# Patient Record
Sex: Male | Born: 1935 | Race: Black or African American | Hispanic: No
Health system: Southern US, Community
[De-identification: ages and names within clinical notes are randomized; demographics above are authoritative.]

## PROBLEM LIST (undated history)

## (undated) DIAGNOSIS — M1990 Unspecified osteoarthritis, unspecified site: Secondary | ICD-10-CM

## (undated) DIAGNOSIS — F4310 Post-traumatic stress disorder, unspecified: Secondary | ICD-10-CM

## (undated) DIAGNOSIS — N400 Enlarged prostate without lower urinary tract symptoms: Secondary | ICD-10-CM

## (undated) DIAGNOSIS — F0390 Unspecified dementia without behavioral disturbance: Secondary | ICD-10-CM

## (undated) DIAGNOSIS — E559 Vitamin D deficiency, unspecified: Secondary | ICD-10-CM

## (undated) DIAGNOSIS — D649 Anemia, unspecified: Secondary | ICD-10-CM

---

## 2017-05-22 ENCOUNTER — Inpatient Hospital Stay
Admission: EM | Admit: 2017-05-22 | Discharge: 2017-05-26 | DRG: 640 | Disposition: A | Discharge status: Hospice | Payer: Medicare Other | Attending: Internal Medicine | Admitting: Internal Medicine

## 2017-05-22 DIAGNOSIS — R0902 Hypoxemia: Secondary | ICD-10-CM | POA: Diagnosis present

## 2017-05-22 DIAGNOSIS — Z9181 History of falling: Secondary | ICD-10-CM

## 2017-05-22 DIAGNOSIS — E43 Unspecified severe protein-calorie malnutrition: Secondary | ICD-10-CM

## 2017-05-22 DIAGNOSIS — N179 Acute kidney failure, unspecified: Secondary | ICD-10-CM | POA: Diagnosis present

## 2017-05-22 DIAGNOSIS — Z79899 Other long term (current) drug therapy: Secondary | ICD-10-CM

## 2017-05-22 DIAGNOSIS — E860 Dehydration: Secondary | ICD-10-CM | POA: Diagnosis present

## 2017-05-22 DIAGNOSIS — M1990 Unspecified osteoarthritis, unspecified site: Secondary | ICD-10-CM | POA: Diagnosis present

## 2017-05-22 DIAGNOSIS — F4310 Post-traumatic stress disorder, unspecified: Secondary | ICD-10-CM | POA: Diagnosis present

## 2017-05-22 DIAGNOSIS — E559 Vitamin D deficiency, unspecified: Secondary | ICD-10-CM | POA: Diagnosis present

## 2017-05-22 DIAGNOSIS — H409 Unspecified glaucoma: Secondary | ICD-10-CM | POA: Diagnosis present

## 2017-05-22 DIAGNOSIS — E870 Hyperosmolality and hypernatremia: Principal | ICD-10-CM | POA: Diagnosis present

## 2017-05-22 DIAGNOSIS — N183 Chronic kidney disease, stage 3 (moderate): Secondary | ICD-10-CM | POA: Diagnosis present

## 2017-05-22 DIAGNOSIS — Z888 Allergy status to other drugs, medicaments and biological substances status: Secondary | ICD-10-CM

## 2017-05-22 DIAGNOSIS — Z7982 Long term (current) use of aspirin: Secondary | ICD-10-CM

## 2017-05-22 DIAGNOSIS — R7989 Other specified abnormal findings of blood chemistry: Secondary | ICD-10-CM

## 2017-05-22 DIAGNOSIS — F0390 Unspecified dementia without behavioral disturbance: Secondary | ICD-10-CM

## 2017-05-22 DIAGNOSIS — N400 Enlarged prostate without lower urinary tract symptoms: Secondary | ICD-10-CM | POA: Diagnosis present

## 2017-05-22 DIAGNOSIS — L8990 Pressure ulcer of unspecified site, unspecified stage: Secondary | ICD-10-CM

## 2017-05-22 DIAGNOSIS — Z515 Encounter for palliative care: Secondary | ICD-10-CM

## 2017-05-22 DIAGNOSIS — Z66 Do not resuscitate: Secondary | ICD-10-CM | POA: Diagnosis not present

## 2017-05-22 DIAGNOSIS — R1310 Dysphagia, unspecified: Secondary | ICD-10-CM

## 2017-05-22 DIAGNOSIS — Z681 Body mass index (BMI) 19 or less, adult: Secondary | ICD-10-CM

## 2017-05-22 DIAGNOSIS — I248 Other forms of acute ischemic heart disease: Secondary | ICD-10-CM | POA: Diagnosis present

## 2017-05-22 DIAGNOSIS — R778 Other specified abnormalities of plasma proteins: Secondary | ICD-10-CM

## 2017-05-22 DIAGNOSIS — R0602 Shortness of breath: Secondary | ICD-10-CM

## 2017-05-22 DIAGNOSIS — R627 Adult failure to thrive: Secondary | ICD-10-CM | POA: Diagnosis present

## 2017-05-22 DIAGNOSIS — G9340 Encephalopathy, unspecified: Secondary | ICD-10-CM | POA: Diagnosis present

## 2017-05-22 DIAGNOSIS — Z7189 Other specified counseling: Secondary | ICD-10-CM

## 2017-05-22 HISTORY — DX: Unspecified osteoarthritis, unspecified site: M19.90

## 2017-05-22 HISTORY — DX: Vitamin D deficiency, unspecified: E55.9

## 2017-05-22 HISTORY — DX: Unspecified dementia, unspecified severity, without behavioral disturbance, psychotic disturbance, mood disturbance, and anxiety: F03.90

## 2017-05-22 HISTORY — DX: Benign prostatic hyperplasia without lower urinary tract symptoms: N40.0

## 2017-05-22 HISTORY — DX: Post-traumatic stress disorder, unspecified: F43.10

## 2017-05-22 HISTORY — DX: Anemia, unspecified: D64.9

## 2017-05-22 LAB — CBC
HCT: 42 % (ref 40.0–52.0)
Hemoglobin: 13.2 g/dL (ref 13.0–18.0)
MCH: 27.9 pg (ref 26.0–34.0)
MCHC: 31.5 g/dL — ABNORMAL LOW (ref 32.0–36.0)
MCV: 88.7 fL (ref 80.0–100.0)
PLATELETS: 254 10*3/uL (ref 150–440)
RBC: 4.74 MIL/uL (ref 4.40–5.90)
RDW: 16.4 % — ABNORMAL HIGH (ref 11.5–14.5)
WBC: 14.9 10*3/uL — ABNORMAL HIGH (ref 3.8–10.6)

## 2017-05-22 NOTE — ED Triage Notes (Signed)
Pt brought in by [facility] for elevated Sodium of 162.  Per EMS, pt also recently finished course of antibiotics for pneumonia.  Pt continues to have a cough, unable to verbalize anything other than "I'm cold".

## 2017-05-22 NOTE — ED Notes (Signed)
Per Erie NoeVanessa, LPN at [facility], pt finished abx for aspiration pneumonia yesterday.  Per Erie NoeVanessa pt has had decreased PO intake since arrival [facility].  Pt dx with failure to thrive and pt had frequent falls prior to [facility].  Pt's baseline is to only be A&O to self.  No family to be coming to hospital tonight.

## 2017-05-23 ENCOUNTER — Emergency Department: Payer: Medicare Other

## 2017-05-23 DIAGNOSIS — R627 Adult failure to thrive: Secondary | ICD-10-CM | POA: Diagnosis present

## 2017-05-23 DIAGNOSIS — R0902 Hypoxemia: Secondary | ICD-10-CM | POA: Diagnosis present

## 2017-05-23 DIAGNOSIS — Z681 Body mass index (BMI) 19 or less, adult: Secondary | ICD-10-CM | POA: Diagnosis not present

## 2017-05-23 DIAGNOSIS — Z7982 Long term (current) use of aspirin: Secondary | ICD-10-CM | POA: Diagnosis not present

## 2017-05-23 DIAGNOSIS — Z79899 Other long term (current) drug therapy: Secondary | ICD-10-CM | POA: Diagnosis not present

## 2017-05-23 DIAGNOSIS — R1310 Dysphagia, unspecified: Secondary | ICD-10-CM | POA: Diagnosis present

## 2017-05-23 DIAGNOSIS — G9340 Encephalopathy, unspecified: Secondary | ICD-10-CM | POA: Diagnosis present

## 2017-05-23 DIAGNOSIS — E43 Unspecified severe protein-calorie malnutrition: Secondary | ICD-10-CM | POA: Diagnosis present

## 2017-05-23 DIAGNOSIS — E870 Hyperosmolality and hypernatremia: Principal | ICD-10-CM

## 2017-05-23 DIAGNOSIS — L8990 Pressure ulcer of unspecified site, unspecified stage: Secondary | ICD-10-CM | POA: Diagnosis present

## 2017-05-23 DIAGNOSIS — H409 Unspecified glaucoma: Secondary | ICD-10-CM | POA: Diagnosis present

## 2017-05-23 DIAGNOSIS — Z888 Allergy status to other drugs, medicaments and biological substances status: Secondary | ICD-10-CM | POA: Diagnosis not present

## 2017-05-23 DIAGNOSIS — Z515 Encounter for palliative care: Secondary | ICD-10-CM | POA: Diagnosis not present

## 2017-05-23 DIAGNOSIS — Z9181 History of falling: Secondary | ICD-10-CM | POA: Diagnosis not present

## 2017-05-23 DIAGNOSIS — N400 Enlarged prostate without lower urinary tract symptoms: Secondary | ICD-10-CM | POA: Diagnosis present

## 2017-05-23 DIAGNOSIS — N183 Chronic kidney disease, stage 3 (moderate): Secondary | ICD-10-CM | POA: Diagnosis present

## 2017-05-23 DIAGNOSIS — E860 Dehydration: Secondary | ICD-10-CM | POA: Diagnosis present

## 2017-05-23 DIAGNOSIS — Z7189 Other specified counseling: Secondary | ICD-10-CM

## 2017-05-23 DIAGNOSIS — N179 Acute kidney failure, unspecified: Secondary | ICD-10-CM | POA: Diagnosis present

## 2017-05-23 DIAGNOSIS — Z66 Do not resuscitate: Secondary | ICD-10-CM | POA: Diagnosis not present

## 2017-05-23 DIAGNOSIS — I248 Other forms of acute ischemic heart disease: Secondary | ICD-10-CM | POA: Diagnosis present

## 2017-05-23 DIAGNOSIS — E559 Vitamin D deficiency, unspecified: Secondary | ICD-10-CM | POA: Diagnosis present

## 2017-05-23 DIAGNOSIS — F4310 Post-traumatic stress disorder, unspecified: Secondary | ICD-10-CM | POA: Diagnosis present

## 2017-05-23 DIAGNOSIS — F0390 Unspecified dementia without behavioral disturbance: Secondary | ICD-10-CM | POA: Diagnosis not present

## 2017-05-23 DIAGNOSIS — M1990 Unspecified osteoarthritis, unspecified site: Secondary | ICD-10-CM | POA: Diagnosis present

## 2017-05-23 LAB — TROPONIN I
Troponin I: 0.09 ng/mL (ref <0.03)
Troponin I: 0.09 ng/mL (ref <0.03)
Troponin I: 0.11 ng/mL (ref <0.03)
Troponin I: 0.14 ng/mL (ref <0.03)

## 2017-05-23 LAB — COMPREHENSIVE METABOLIC PANEL
ALT: 12 U/L — AB (ref 17–63)
AST: 18 U/L (ref 15–41)
Albumin: 3.5 g/dL (ref 3.5–5.0)
Alkaline Phosphatase: 81 U/L (ref 38–126)
Anion gap: 10 (ref 5–15)
BUN: 45 mg/dL — ABNORMAL HIGH (ref 6–20)
CHLORIDE: 124 mmol/L — AB (ref 101–111)
CO2: 25 mmol/L (ref 22–32)
CREATININE: 1.78 mg/dL — AB (ref 0.61–1.24)
Calcium: 10.8 mg/dL — ABNORMAL HIGH (ref 8.9–10.3)
GFR calc non Af Amer: 31 mL/min — ABNORMAL LOW (ref >60)
GFR, EST AFRICAN AMERICAN: 36 mL/min — AB (ref >60)
Glucose, Bld: 112 mg/dL — ABNORMAL HIGH (ref 65–99)
Potassium: 4.2 mmol/L (ref 3.5–5.1)
SODIUM: 159 mmol/L — AB (ref 135–145)
Total Bilirubin: 0.7 mg/dL (ref 0.3–1.2)
Total Protein: 7.8 g/dL (ref 6.5–8.1)

## 2017-05-23 LAB — MRSA PCR SCREENING: MRSA by PCR: NEGATIVE

## 2017-05-23 LAB — SODIUM
SODIUM: 159 mmol/L — AB (ref 135–145)
SODIUM: 157 mmol/L — AB (ref 135–145)
Sodium: 154 mmol/L — ABNORMAL HIGH (ref 135–145)

## 2017-05-23 LAB — LACTIC ACID, PLASMA
LACTIC ACID, VENOUS: 1.8 mmol/L (ref 0.5–1.9)
Lactic Acid, Venous: 1.4 mmol/L (ref 0.5–1.9)

## 2017-05-23 LAB — GLUCOSE, CAPILLARY: Glucose-Capillary: 114 mg/dL — ABNORMAL HIGH (ref 65–99)

## 2017-05-23 LAB — TSH: TSH: 0.964 u[IU]/mL (ref 0.350–4.500)

## 2017-05-23 MED ORDER — ASPIRIN EC 81 MG PO TBEC
81.0000 mg | DELAYED_RELEASE_TABLET | Freq: Every day | ORAL | Status: DC
  Filled 2017-05-23: qty 1

## 2017-05-23 MED ORDER — ACETAMINOPHEN 325 MG PO TABS: 650.0000 mg | ORAL_TABLET | Freq: Four times a day (QID) | ORAL | Status: DC | PRN

## 2017-05-23 MED ORDER — SERTRALINE HCL 50 MG PO TABS: 50.0000 mg | ORAL_TABLET | Freq: Every day | ORAL | Status: DC

## 2017-05-23 MED ORDER — POLYVINYL ALCOHOL 1.4 % OP SOLN
1.0000 [drp] | Freq: Three times a day (TID) | OPHTHALMIC | Status: DC
Start: 2017-05-23 — End: 2017-05-25
  Administered 2017-05-23 – 2017-05-25 (×5): 1 [drp] via OPHTHALMIC
  Filled 2017-05-23: qty 15

## 2017-05-23 MED ORDER — ORAL CARE MOUTH RINSE
15.0000 mL | Freq: Two times a day (BID) | OROMUCOSAL | Status: DC
  Administered 2017-05-23 – 2017-05-24 (×3): 15 mL via OROMUCOSAL

## 2017-05-23 MED ORDER — DOCUSATE SODIUM 100 MG PO CAPS
100.0000 mg | ORAL_CAPSULE | Freq: Two times a day (BID) | ORAL | Status: DC
  Filled 2017-05-23: qty 1

## 2017-05-23 MED ORDER — DEXTROSE 5 % IV SOLN
INTRAVENOUS | Status: DC
  Administered 2017-05-23 – 2017-05-24 (×4): via INTRAVENOUS

## 2017-05-23 MED ORDER — CARBOXYMETHYLCELLULOSE SODIUM 0.5 % OP SOLN: 1.0000 [drp] | Freq: Three times a day (TID) | OPHTHALMIC | Status: DC

## 2017-05-23 MED ORDER — GUAIFENESIN 100 MG/5ML PO SOLN
200.0000 mg | Freq: Three times a day (TID) | ORAL | Status: DC | PRN
  Filled 2017-05-23: qty 10

## 2017-05-23 MED ORDER — IPRATROPIUM-ALBUTEROL 0.5-2.5 (3) MG/3ML IN SOLN: 3.0000 mL | Freq: Four times a day (QID) | RESPIRATORY_TRACT | Status: DC | PRN

## 2017-05-23 MED ORDER — ACETAMINOPHEN 650 MG RE SUPP: 650.0000 mg | Freq: Four times a day (QID) | RECTAL | Status: DC | PRN

## 2017-05-23 MED ORDER — VITAMIN D 1000 UNITS PO TABS
1000.0000 [IU] | ORAL_TABLET | Freq: Every day | ORAL | Status: DC
  Filled 2017-05-23: qty 1

## 2017-05-23 MED ORDER — SODIUM CHLORIDE 0.9 % IV BOLUS (SEPSIS)
1000.0000 mL | Freq: Once | INTRAVENOUS | Status: AC
  Administered 2017-05-23: 1000 mL via INTRAVENOUS

## 2017-05-23 MED ORDER — ONDANSETRON HCL 4 MG PO TABS: 4.0000 mg | ORAL_TABLET | Freq: Four times a day (QID) | ORAL | Status: DC | PRN

## 2017-05-23 MED ORDER — RISAQUAD PO CAPS
1.0000 | ORAL_CAPSULE | Freq: Two times a day (BID) | ORAL | Status: DC
  Filled 2017-05-23: qty 1

## 2017-05-23 MED ORDER — ONDANSETRON HCL 4 MG/2ML IJ SOLN: 4.0000 mg | Freq: Four times a day (QID) | INTRAMUSCULAR | Status: DC | PRN

## 2017-05-23 MED ORDER — TRAMADOL HCL 50 MG PO TABS: 25.0000 mg | ORAL_TABLET | Freq: Three times a day (TID) | ORAL | Status: DC | PRN

## 2017-05-23 MED ORDER — FINASTERIDE 5 MG PO TABS
5.0000 mg | ORAL_TABLET | Freq: Every day | ORAL | Status: DC
  Filled 2017-05-23: qty 1

## 2017-05-23 MED ORDER — MELATONIN 5 MG PO TABS
5.0000 mg | ORAL_TABLET | Freq: Every day | ORAL | Status: DC
  Filled 2017-05-23 (×3): qty 1

## 2017-05-23 MED ORDER — SENNOSIDES-DOCUSATE SODIUM 8.6-50 MG PO TABS: 2.0000 | ORAL_TABLET | Freq: Every day | ORAL | Status: DC | PRN

## 2017-05-23 MED ORDER — TAMSULOSIN HCL 0.4 MG PO CAPS: 0.4000 mg | ORAL_CAPSULE | Freq: Every day | ORAL | Status: DC

## 2017-05-23 MED ORDER — HYDROCERIN EX CREA
1.0000 "application " | TOPICAL_CREAM | Freq: Two times a day (BID) | CUTANEOUS | Status: DC
  Administered 2017-05-23 – 2017-05-26 (×7): 1 via TOPICAL
  Filled 2017-05-23: qty 113

## 2017-05-23 MED ORDER — POTASSIUM CHLORIDE CRYS ER 20 MEQ PO TBCR: 20.0000 meq | EXTENDED_RELEASE_TABLET | Freq: Every day | ORAL | Status: DC

## 2017-05-23 MED ORDER — DORZOLAMIDE HCL-TIMOLOL MAL 2-0.5 % OP SOLN
1.0000 [drp] | Freq: Two times a day (BID) | OPHTHALMIC | Status: DC
  Administered 2017-05-23 – 2017-05-26 (×7): 1 [drp] via OPHTHALMIC
  Filled 2017-05-23: qty 10

## 2017-05-23 MED ORDER — HEPARIN SODIUM (PORCINE) 5000 UNIT/ML IJ SOLN
5000.0000 [IU] | Freq: Three times a day (TID) | INTRAMUSCULAR | Status: DC
  Administered 2017-05-23 – 2017-05-25 (×5): 5000 [IU] via SUBCUTANEOUS
  Filled 2017-05-23 (×6): qty 1

## 2017-05-23 NOTE — Progress Notes (Addendum)
Family Meeting Note  Advance Directive:yes  Today a meeting took place with the Patient's healthcare power of attorney Mr. Ronald James in patient's room.  Patient is encephalopathic and unable to participate in the conversation [facility] to [facility] needs to be full code at the standpoint as patient expressed to be full code in the past.  Agreeable with the palliative care consult The diagnosis and plan of care discussed in detail with the patient's POA    The following clinical team members were present during this meeting:md, rn  The following were discussed:Patient's diagnosis: , Patient's progosis: Unable to determine and Goals for treatment: Full Code  Additional follow-up to be provided: Hospitalist and palliative care  Time spent during discussion: 18 min  Ramonita LabAruna Shakira Los, MD

## 2017-05-23 NOTE — Progress Notes (Signed)
Initial Nutrition Assessment  DOCUMENTATION CODES:   Severe malnutrition in context of chronic illness, Underweight  INTERVENTION:  No appropriate nutrition intervention at this time as patient is unsafe for PO intake.  Will monitor outcome of discussions regarding goals of care.  If patient becomes alert enough for SLP assessment and PO intake, recommend liberalizing sodium restriction.  NUTRITION DIAGNOSIS:   Severe Malnutrition related to chronic illness(CKD III, dementia, advanced age) as evidenced by severe fat depletion, severe muscle depletion.   GOAL:   Patient will meet greater than or equal to 90% of their needs  MONITOR:   PO intake, Supplement acceptance, Labs, Weight trends, Skin, I & O's  REASON FOR ASSESSMENT:   Malnutrition Screening Tool, Consult Assessment of nutrition requirement/status, Poor PO  ASSESSMENT:   84 year old male with PMHx of dementia, BPH, OA, PTSD, vitamin D deficiency, CKD III admitted with hypernatremia.   -Pending PMT consult and SLP consult.  Patient sleeping at time of RD assessment. Not alert enough to provide history and no family members present at bedside. Per chart patient is on a mechanical soft diet with thickened liquids at home. Discussed with RN. Patient is unsafe for PO intake at this time.  Meal Completion: 0%  Medications reviewed and include: acidophilus, vitamin D 1000 units daily, Colace, D5W at 100 mL/hr (120 grams dextrose, 408 kcal daily).  Labs reviewed: Sodium 159, Chloride 124, BUN 45, Creatinine 1.78, elevated Troponin.  NUTRITION - FOCUSED PHYSICAL EXAM:    Most Recent Value  Orbital Region  Severe depletion  Upper Arm Region  Severe depletion  Thoracic and Lumbar Region  Severe depletion  Buccal Region  Severe depletion  Temple Region  Severe depletion  Clavicle Bone Region  Severe depletion  Clavicle and Acromion Bone Region  Severe depletion  Scapular Bone Region  Unable to assess  Dorsal Hand   Severe depletion  Patellar Region  Severe depletion  Anterior Thigh Region  Severe depletion  Posterior Calf Region  Severe depletion  Edema (RD Assessment)  None  Hair  Reviewed  Eyes  Unable to assess  Mouth  Unable to assess  Skin  Reviewed  Nails  Reviewed     Diet Order:  Diet 2 gram sodium Room service appropriate? Yes; Fluid consistency: Thin  EDUCATION NEEDS:   Not appropriate for education at this time  Skin:  Skin Assessment: Reviewed RN Assessment(boggy heels)  Last BM:  05/23/2017  Height:   Ht Readings from Last 1 Encounters:  No data found for Ht    Weight:   Wt Readings from Last 1 Encounters:  05/23/17 106 lb 3.2 oz (48.2 kg)    Ideal Body Weight:  67.3 kg(calculated with height of 5\' 7"  found in Care Everywhere)  BMI:  There is no height or weight on file to calculate BMI. BMI 16.6 kg/m2  Estimated Nutritional Needs:   Kcal:  1205-1445 (25-30 kcal/kg)  Protein:  72-82 grams (1.5-1.7 grams/kg)  Fluid:  1.2 L/day (25 mL/kg)  [name], MS, RD, LDN Office: [phone] Pager: [phone] After Hours/Weekend Pager: [phone]

## 2017-05-23 NOTE — Plan of Care (Signed)
VS WDL, free of falls since admission to unit.  Denies pain, no needs.  Disoriented, not impulsive since arriving to unit.  [facility]  Remainder of profile will be completed when wife arrives today.  Bed in low position, call bell within reach.  WCTM.

## 2017-05-23 NOTE — ED Provider Notes (Signed)
[facility] Emergency Department Provider Note   ____________________________________________   First MD Initiated Contact with Patient 05/22/17 2334     (approximate)  I have reviewed the triage vital signs and the nursing notes.   HISTORY  Chief Complaint Abnormal Lab  Patient with dementia so unable to provide history  HPI Ronald James is a 84 y.o. male who comes into the hospital today from his nursing home.  The nursing home states that the patient had some elevated sodium.  According to staff they state that they drew labs for the patient and they were told that the labs were elevated.  He has a history of kidney failure and failure to thrive.  They report that he recently completed antibiotics for aspiration pneumonia.  The patient has dementia according to staff.  He knows who he is but that is approximately it.  The patient has not been eating or drinking and does not walk on his own.  He also has a history of falls.  The patient thinks it is Sunday and does not know the month.  He keeps saying that he needs to go to the trash and according to the staff at the nursing home he keeps stating that he needs someone to turn the heat up because he is cold.  He is here today for evaluation and treatment.  The patient denies any pain but he does mumble so it is difficult to understand what he says.   Past Medical History:  Diagnosis Date  . Anemia   . BPH (benign prostatic hyperplasia)   . Dementia   . Osteoarthritis   . PTSD (post-traumatic stress disorder)   . Vitamin D deficiency     There are no active problems to display for this patient.   History reviewed. No pertinent surgical history.  Prior to Admission medications   Not on File    Allergies Brimonidine; Latanoprost; Lisinopril; and Prazosin  No family history on file.  Social History Social History   Tobacco Use  . Smoking status: Unknown If Ever Smoked  Substance Use Topics  .  Alcohol use: No    Frequency: Never  . Drug use: No    Review of Systems  Constitutional: Failure to thrive Eyes: No visual changes. ENT: No sore throat. Cardiovascular: Denies chest pain. Respiratory: Denies shortness of breath. Gastrointestinal: No abdominal pain.   Genitourinary: Negative for dysuria. Musculoskeletal: Negative for back pain. Skin: Negative for rash. Neurological: Negative for headaches,   ____________________________________________   PHYSICAL EXAM:  VITAL SIGNS: ED Triage Vitals [05/22/17 2335]  Enc Vitals Group     BP 134/72     Pulse Rate 97     Resp (!) 22     Temp 97.7 F (36.5 C)     Temp Source Oral     SpO2 98 %     Weight 115 lb (52.2 kg)     Height      Head Circumference      Peak Flow      Pain Score      Pain Loc      Pain Edu?      Excl. in GC?     Constitutional: Alert and oriented to self.  Cachectic appearing and in no acute distress. Eyes: Conjunctivae are normal. PERRL. EOMI. Head: Atraumatic. Nose: No congestion/rhinnorhea. Mouth/Throat: Mucous membranes are moist.  Oropharynx non-erythematous. Cardiovascular: Normal rate, regular rhythm. Grossly normal heart sounds.  Good peripheral circulation. Respiratory: Normal respiratory effort.  No retractions.  Coarse breath sounds with some upper airway gurgling. Gastrointestinal: Soft and nontender. No distention.  Positive bowel sounds Musculoskeletal: No lower extremity tenderness nor edema.   Neurologic: Quiet garbled speech with some mumbling Skin:  Skin is warm, dry and intact.  Psychiatric: Mood and affect are normal.   ____________________________________________   LABS (all labs ordered are listed, but only abnormal results are displayed)  Labs Reviewed  CBC - Abnormal; Notable for the following components:      Result Value   WBC 14.9 (*)    MCHC 31.5 (*)    RDW 16.4 (*)    All other components within normal limits  COMPREHENSIVE METABOLIC PANEL - Abnormal;  Notable for the following components:   Sodium 159 (*)    Chloride 124 (*)    Glucose, Bld 112 (*)    BUN 45 (*)    Creatinine, Ser 1.78 (*)    Calcium 10.8 (*)    ALT 12 (*)    GFR calc non Af Amer 31 (*)    GFR calc Af Amer 36 (*)    All other components within normal limits  TROPONIN I - Abnormal; Notable for the following components:   Troponin I 0.11 (*)    All other components within normal limits  CULTURE, BLOOD (ROUTINE X 2)  CULTURE, BLOOD (ROUTINE X 2)  LACTIC ACID, PLASMA  LACTIC ACID, PLASMA   ____________________________________________  EKG  ED ECG REPORT I, Rebecka Apley, the attending physician, personally viewed and interpreted this ECG.   Date: 05/22/2017  EKG Time: 2313  Rate: 95  Rhythm: normal sinus rhythm  Axis: normal  Intervals:right bundle branch block and left anterior fascicular block  ST&T Change: Flipped T waves in lead V2, aVL  ____________________________________________  RADIOLOGY  Dg Chest Portable 1 View  Result Date: 05/23/2017 CLINICAL DATA:  84 year old male with cough. EXAM: PORTABLE CHEST 1 VIEW COMPARISON:  None. FINDINGS: The lungs are clear. There is no pleural effusion or pneumothorax. Top-normal cardiac silhouette. The aorta is tortuous. Osteopenia. No acute osseous pathology. IMPRESSION: No active disease. Electronically Signed   By: Elgie Collard M.D.   On: 05/23/2017 00:36    ____________________________________________   PROCEDURES  Procedure(s) performed: None  Procedures  Critical Care performed: No  ____________________________________________   INITIAL IMPRESSION / ASSESSMENT AND PLAN / ED COURSE  As part of my medical decision making, I reviewed the following data within the electronic MEDICAL RECORD NUMBER Notes from prior ED visits and [place] Controlled Substance Database  This is a 84 year old male who comes into the hospital today from his nursing home with an elevated sodium level.  The patient  has failure to thrive and has not been eating so I feel that he may have some hypernatremia due to dehydration.  I will give the patient a liter of normal saline.  I did order blood work and the patient's sodium here is 159 with a chloride of 124 the patient's creatinine is 1.78 and we do not have any previous labs to compare.  I did check a lactic acid which was 1.8.  I will admit the patient to the hospitalist service for hydration.  The patient's troponin is also elevated at 0.11.  He did receive a chest x-ray as well which was negative.  The patient does not have a good cough which is why he has the increased sounds of secretion.  He will be admitted and further evaluated.  ____________________________________________   FINAL CLINICAL IMPRESSION(S) / ED DIAGNOSES  Final diagnoses:  Hypernatremia  Elevated troponin     ED Discharge Orders    None       Note:  This document was prepared using Dragon voice recognition software and may include unintentional dictation errors.    Rebecka Apley, MD 05/23/17 [phone]

## 2017-05-23 NOTE — Clinical Social Work Note (Signed)
CSW received consult that patient is a [facility].  CSW to complete assessment at at a later time.  Ervin KnackEric R. Yehudit Fulginiti, MSW, Theresia MajorsLCSWA [phone]  05/23/2017 7:11 PM

## 2017-05-23 NOTE — Consult Note (Signed)
Consultation Note Date: 05/23/2017   Patient Name: Ronald James  DOB: [birth year]  MRN: [id number]  Age / Sex: 84 y.o., male  PCP: Patient, No Pcp Per Referring Physician: Nicholes Mango, MD  Reason for Consultation: Establishing goals of care  HPI/Patient Profile: 84 y.o. male  with past medical history of dementia, CKD stage III, BPH, osteoarthritis, PTSD, Vitamin D deficiency admitted on 05/22/2017 with hypernatremia r/t poor intake and dehydration. Palliative care requested to assist with [place] conversations.   Clinical Assessment and Goals of Care: I met today with Ronald James who is lethargic but arouses to answer my questions and then goes back to sleep. He is thin and frail. He denies any pains or discomforts and is pleasantly confused.   I called and spoke with his [place]. I validated his concern that Ronald James is not eating and drinking and is dehydrated. I further explained dementia and natural trajectory of dementia specifically in relation to aspiration and decreased intake. I explained that I fear this is a problem that will continue to reoccur and that we cannot reverse these complications of dementia. I explained that we can provide IVF to rehydrate but that is likely only temporary before this happens again.   I explained to Ronald James that we need to start talking about a plan to take good care of Ronald James if we cannot fix his problems. Ronald James says that he will call and speak further with Ronald James son and they will discuss how to discuss with Ronald James wife (who he reports is very ill herself d/t cancer). I asked Ronald James if they'd ever discussed resuscitation such as CPR/shock/intubation and life support. Ronald James says they will need to talk about this. I explained that I would hate to put Mr. Harriott through these measures since we know he has a condition that we cannot reverse. Mr.  Tyrone James seems to understand.   Ronald James says that he has a dental procedure he cannot postpone but would like to meet with me Thursday 1/17 1100 am and will check to see if Ronald James son, "Ronald James," can be available at this time as well. I will try and touch base with Ronald James again tomorrow. I provided him with my contact info as well. Emotional support provided.   Primary Decision Maker HCPOA friend Gasper Sells    SUMMARY Jennings to have further conversations with family about Mr. Ronald James decline - Plan to meet in person to better answer questions and develop plan  Code Status/Advance Care Planning:  Full code - family/HCPOA to discuss   Symptom Management:   None currently  Palliative Prophylaxis:   Aspiration, Bowel Regimen, Delirium Protocol, Frequent Pain Assessment, Oral Care and Turn Reposition  Additional Recommendations (Limitations, Scope, Preferences):  Full Scope Treatment  Psycho-social/Spiritual:   Desire for further Chaplaincy support:yes  Additional Recommendations: Education on Hospice and Grief/Bereavement Support  Prognosis:   Very poor with recent recurrent aspiration pneumonia and declining intake.  Discharge Planning: To Be Determined      Primary Diagnoses: Present on Admission: . Hypernatremia   I have reviewed the medical record, interviewed the patient and family, and examined the patient. The following aspects are pertinent.  Past Medical History:  Diagnosis Date  . Anemia   . BPH (benign prostatic hyperplasia)   . Dementia   . Osteoarthritis   . PTSD (post-traumatic stress disorder)   . Vitamin D deficiency    Social History   Socioeconomic History  . Marital status: Unknown    Spouse name: None  . Number of children: None  . Years of education: None  . Highest education level: None  Social Needs  . Financial resource strain: None  . Food insecurity - worry: None  . Food insecurity - inability:  None  . Transportation needs - medical: None  . Transportation needs - non-medical: None  Occupational History  . None  Tobacco Use  . Smoking status: Unknown If Ever Smoked  Substance and Sexual Activity  . Alcohol use: No    Frequency: Never  . Drug use: No  . Sexual activity: None  Other Topics Concern  . None  Social History Narrative  . None   No family history on file. Scheduled Meds: . acidophilus  1 capsule Oral BID  . aspirin EC  81 mg Oral Daily  . cholecalciferol  1,000 Units Oral Daily  . docusate sodium  100 mg Oral BID  . dorzolamide-timolol  1 drop Right Eye BID  . finasteride  5 mg Oral Daily  . heparin  5,000 Units Subcutaneous Q8H  . hydrocerin  1 application Topical BID  . mouth rinse  15 mL Mouth Rinse BID  . Melatonin  5 mg Oral QHS  . polyvinyl alcohol  1 drop Both Eyes TID  . sertraline  50 mg Oral QHS  . tamsulosin  0.4 mg Oral QHS   Continuous Infusions: . dextrose 100 mL/hr at 05/23/17 1345   PRN Meds:.acetaminophen **OR** acetaminophen, guaiFENesin, ipratropium-albuterol, ondansetron **OR** ondansetron (ZOFRAN) IV, senna-docusate, traMADol Allergies  Allergen Reactions  . Brimonidine Other (See Comments)    Reaction: unknown  . Latanoprost Other (See Comments)    Reaction: unknown  . Lisinopril Other (See Comments)    Reaction: unknown  . Prazosin Other (See Comments)    Reaction: unknown   Review of Systems  Unable to perform ROS: Dementia    Physical Exam  Constitutional:  Thin, frail, elderly  Cardiovascular: Normal rate and regular rhythm.  Pulmonary/Chest: Effort normal and breath sounds normal. No accessory muscle usage. No tachypnea. No respiratory distress.  Abdominal: Soft. Normal appearance.  Neurological: He is alert.  Oriented to self  Nursing note and vitals reviewed.   Vital Signs: BP 126/74 (BP Location: Right Arm)   Pulse 94   Temp (!) 97.3 F (36.3 C) (Oral)   Resp 18   Wt 48.2 kg (106 lb 3.2 oz)   SpO2  100%  Pain Assessment: PAINAD       SpO2: SpO2: 100 % O2 Device:SpO2: 100 % O2 Flow Rate: .O2 Flow Rate (L/min): 2 L/min  IO: Intake/output summary:   Intake/Output Summary (Last 24 hours) at 05/23/2017 1620 Last data filed at 05/23/2017 1300 Gross per 24 hour  Intake 1250 ml  Output -  Net 1250 ml    LBM: Last BM Date: 05/23/17 Baseline Weight: Weight: 52.2 kg (115 lb) Most recent weight: Weight: 48.2 kg (106 lb 3.2 oz)  Palliative Assessment/Data: 20%     Time Total: 60 min  Greater than 50%  of this time was spent counseling and coordinating care related to the above assessment and plan.  Signed by: Vinie Sill, NP Palliative Medicine Team Pager # [phone] (M-F 8a-5p) Team Phone # [phone] (Nights/Weekends)

## 2017-05-23 NOTE — Care Management Note (Signed)
Case Management Note  Patient Details  Name: Helmut MusterMack Vandermeer MRN: [id number] Date of Birth: 1921-06-13  Subjective/Objective:     Ad[facility] to [facility] with the diagnosis of hypernatremia. [facility].  Sodium = 159 Troponin -  0.14  Speech and Palliative Care ordered    Clinical Social  Worker will follow for possible return to [facility]            Action/Plan: Will continue to follow for discharge plans, if needed    Expected Discharge Date:  05/25/17               Expected Discharge Plan:     In-House Referral:   yes  Discharge planning Services   yes  Post Acute Care Choice:    Choice offered to:     DME Arranged:    DME Agency:     HH Arranged:    HH Agency:     Status of Service:     If discussed at [employer] Meetings, dates discussed:    Additional Comments:  Gwenette GreetBrenda S Cleora Karnik, RN MSN CCM Care Management [phone] 05/23/2017, 1:14 PM

## 2017-05-23 NOTE — ED Notes (Signed)
Dr Sheryle Hailiamond made aware of pt's O2 sats in the 88-92% range on RA; pt placed on 2L O2 via [place] at this time.

## 2017-05-23 NOTE — Progress Notes (Signed)
SLP Cancellation Note  Patient Details Name: Ronald James MRN: [id number] DOB: 11/15/1921   Cancelled treatment:       Reason Eval/Treat Not Completed: Fatigue/lethargy limiting ability to participate;Patient not medically ready(chart reviewed; consulted NSG. Pt is NPO. ). Due to pt's decreased alertness and lethargy, he is NPO secondary to high risk for aspiration. MD reported encephalopathy and moaning in response to stimulation. ST services will f/u w/ pt's BSE when pt's medical status and alertness have improved for safe assessment.    Jerilynn SomKatherine Watson, MS, CCC-SLP Watson,Katherine 05/23/2017, 5:05 PM

## 2017-05-23 NOTE — ED Notes (Signed)
Date and time results received: 05/23/17 1:19 AM  Test: Trop Critical Value: 0.11  Name of Provider Notified: Dr. Zenda AlpersWebster  Orders Received? Or Actions Taken?:acknowledged

## 2017-05-23 NOTE — H&P (Signed)
Ronald James is an 84 y.o. male.   Chief Complaint: Cough HPI: The patient with past medical history of dementia and BPH presents to the emergency department with cough.  He was sent from his nursing home due to cough and leukocytosis.  The patient would state only that he felt "cold".  The patient cannot contribute to his history due to dementia.  Laboratory evaluation revealed significant hyponatremia with occasional hypoxia which prompted the emergency department staff to call the hospitalist service for admission.  Past Medical History:  Diagnosis Date  . Anemia   . BPH (benign prostatic hyperplasia)   . Dementia   . Osteoarthritis   . PTSD (post-traumatic stress disorder)   . Vitamin D deficiency     History reviewed. No pertinent surgical history.  The patient has dementia and cannot contribute to his history  No family history on file.  Patient cannot contribute to his own history Social History:  reports that he does not drink alcohol or use drugs. His tobacco history is not on file.  Allergies:  Allergies  Allergen Reactions  . Brimonidine Other (See Comments)    Reaction: unknown  . Latanoprost Other (See Comments)    Reaction: unknown  . Lisinopril Other (See Comments)    Reaction: unknown  . Prazosin Other (See Comments)    Reaction: unknown    Medications Prior to Admission  Medication Sig Dispense Refill  . acetaminophen (TYLENOL) 325 MG tablet Take 650 mg by mouth 3 (three) times daily.    Marland Kitchen acidophilus (RISAQUAD) CAPS capsule Take 1 capsule by mouth 2 (two) times daily.    Marland Kitchen aspirin EC 81 MG tablet Take 81 mg by mouth daily.    . carboxymethylcellulose (REFRESH TEARS) 0.5 % SOLN Place 1 drop into both eyes 3 (three) times daily.    . cholecalciferol (VITAMIN D) 1000 units tablet Take 1,000 Units by mouth daily.    . dorzolamide-timolol (COSOPT) 22.3-6.8 MG/ML ophthalmic solution Place 1 drop into the right eye 2 (two) times daily.    . finasteride (PROSCAR) 5 MG  tablet Take 5 mg by mouth daily.    Marland Kitchen guaiFENesin (ROBITUSSIN) 100 MG/5ML liquid Take 200 mg by mouth 3 (three) times daily as needed for cough.    . hydrocerin (EUCERIN) CREA Apply 1 application topically 2 (two) times daily.    Marland Kitchen ipratropium-albuterol (DUONEB) 0.5-2.5 (3) MG/3ML SOLN Take 3 mLs by nebulization every 6 (six) hours as needed (wheezing/ shortness of breath).    . Melatonin 3 MG TABS Take by mouth.    . potassium chloride (MICRO-K) 10 MEQ CR capsule Take 20 mEq by mouth daily.    Marland Kitchen senna-docusate (SENOKOT-S) 8.6-50 MG tablet Take 2 tablets by mouth daily as needed for mild constipation.    . sertraline (ZOLOFT) 50 MG tablet Take 50 mg by mouth at bedtime.    . tamsulosin (FLOMAX) 0.4 MG CAPS capsule Take 0.4 mg by mouth at bedtime.    . traMADol (ULTRAM) 50 MG tablet Take 25 mg by mouth every 8 (eight) hours as needed for moderate pain.      Results for orders placed or performed during the hospital encounter of 05/22/17 (from the past 48 hour(s))  CBC     Status: Abnormal   Collection Time: 05/22/17 11:27 PM  Result Value Ref Range   WBC 14.9 (H) 3.8 - 10.6 K/uL   RBC 4.74 4.40 - 5.90 MIL/uL   Hemoglobin 13.2 13.0 - 18.0 g/dL   HCT 42.0  40.0 - 52.0 %   MCV 88.7 80.0 - 100.0 fL   MCH 27.9 26.0 - 34.0 pg   MCHC 31.5 (L) 32.0 - 36.0 g/dL   RDW 16.4 (H) 11.5 - 14.5 %   Platelets 254 150 - 440 K/uL    Comment: Performed at [facility], [address] [place], [place] [address]  Comprehensive metabolic panel     Status: Abnormal   Collection Time: 05/22/17 11:27 PM  Result Value Ref Range   Sodium 159 (H) 135 - 145 mmol/L   Potassium 4.2 3.5 - 5.1 mmol/L   Chloride 124 (H) 101 - 111 mmol/L   CO2 25 22 - 32 mmol/L   Glucose, Bld 112 (H) 65 - 99 mg/dL   BUN 45 (H) 6 - 20 mg/dL   Creatinine, Ser 1.78 (H) 0.61 - 1.24 mg/dL   Calcium 10.8 (H) 8.9 - 10.3 mg/dL   Total Protein 7.8 6.5 - 8.1 g/dL   Albumin 3.5 3.5 - 5.0 g/dL   AST 18 15 - 41 U/L   ALT 12 (L) 17 -  63 U/L   Alkaline Phosphatase 81 38 - 126 U/L   Total Bilirubin 0.7 0.3 - 1.2 mg/dL   GFR calc non Af Amer 31 (L) >60 mL/min   GFR calc Af Amer 36 (L) >60 mL/min    Comment: (NOTE) The eGFR has been calculated using the CKD EPI equation. This calculation has not been validated in all clinical situations. eGFR's persistently <60 mL/min signify possible Chronic Kidney Disease.    Anion gap 10 5 - 15    Comment: Performed at [facility], [place], [place] [address]  Lactic acid, plasma     Status: None   Collection Time: 05/23/17 12:13 AM  Result Value Ref Range   Lactic Acid, Venous 1.4 0.5 - 1.9 mmol/L    Comment: Performed at [facility], [address] [place], [place] [address]  Lactic acid, plasma     Status: None   Collection Time: 05/23/17 12:13 AM  Result Value Ref Range   Lactic Acid, Venous 1.8 0.5 - 1.9 mmol/L    Comment: Performed at [facility], [address] [place], [place] [address]  Troponin I     Status: Abnormal   Collection Time: 05/23/17 12:13 AM  Result Value Ref Range   Troponin I 0.11 (HH) <0.03 ng/mL    Comment: CRITICAL RESULT CALLED TO, READ BACK BY AND VERIFIED WITH IRIS GUIDRYON 05/23/17 AT 0102 JAG Performed at [facility], [address], [place], [place] [address]    Dg Chest Portable 1 View  Result Date: 05/23/2017 CLINICAL DATA:  84 year old male with cough. EXAM: PORTABLE CHEST 1 VIEW COMPARISON:  None. FINDINGS: The lungs are clear. There is no pleural effusion or pneumothorax. Top-normal cardiac silhouette. The aorta is tortuous. Osteopenia. No acute osseous pathology. IMPRESSION: No active disease. Electronically Signed   By: Anner Crete M.D.   On: 05/23/2017 00:36    Review of Systems  Unable to perform ROS: Dementia  Constitutional: Positive for chills.    Blood pressure 102/62, pulse 94, temperature 97.7 F (36.5 C), temperature source Oral, resp. rate 18, weight 52.2  kg (115 lb), SpO2 92 %. Physical Exam  Nursing note and vitals reviewed. Constitutional: He is oriented to person, place, and time. He appears well-developed and well-nourished. No distress.  HENT:  Head: Normocephalic and atraumatic.  Mouth/Throat: Oropharynx is clear and moist.  Eyes:  Conjunctivae and EOM are normal. Pupils are equal, round, and reactive to light. No scleral icterus.  Neck: Normal range of motion. Neck supple. No JVD present. No tracheal deviation present. No thyromegaly present.  Cardiovascular: Normal rate, regular rhythm and normal heart sounds. Exam reveals no gallop and no friction rub.  No murmur heard. Respiratory: Breath sounds normal. No respiratory distress.  GI: Soft. Bowel sounds are normal. He exhibits no distension. There is no tenderness.  Genitourinary:  Genitourinary Comments: Deferred  Musculoskeletal: Normal range of motion. He exhibits no edema.  Lymphadenopathy:    He has no cervical adenopathy.  Neurological: He is alert and oriented to person, place, and time. No cranial nerve deficit.  Skin: Skin is warm and dry. No rash noted. No erythema.  Psychiatric: He has a normal mood and affect. His behavior is normal.  Thought and judgement poor due to dementia     Assessment/Plan This is a 84 year old male admitted for hyponatremia. 1.  Hyponatremia: No mental status changes per nursing notes.  Hydrate with D5W.  Check serum sodium every 6 to monitor rate of decrease. 2.  Elevated troponin: Secondary to rigors or demand ischemia.  Follow cardiac biomarkers.  Differential diagnosis includes poor renal clearance.  Continue aspirin 3.  CKD: Stage III; avoid nephrotoxic agents. 4.  Dementia: Continue Zoloft 5.  BPH: Continue finasteride and tamsulosin 6.  DVT prophylaxis: Heparin 7.  GI prophylaxis: None The patient is a full code.  Time spent on admission orders and patient care approximately 45 minutes  Harrie Foreman, MD 05/23/2017, 3:58  AM

## 2017-05-23 NOTE — Progress Notes (Signed)
[facility] [place] at [facility]   PATIENT NAME: Ronald James    MR#:  [id number]  DATE OF BIRTH:  December 15, 1921  SUBJECTIVE:  CHIEF COMPLAINT:   Patient is altered, moans but not opening eyes.  His healthcare power of attorney Mr. Kathlene November is at bedside.  Patient carries over good conversation at his baseline according to the healthcare POA REVIEW OF SYSTEMS:  Review of system unobtainable as the patient is lethargic  DRUG ALLERGIES:   Allergies  Allergen Reactions  . Brimonidine Other (See Comments)    Reaction: unknown  . Latanoprost Other (See Comments)    Reaction: unknown  . Lisinopril Other (See Comments)    Reaction: unknown  . Prazosin Other (See Comments)    Reaction: unknown    VITALS:  Blood pressure 126/74, pulse 94, temperature (!) 97.3 F (36.3 C), temperature source Oral, resp. rate 18, weight 48.2 kg (106 lb 3.2 oz), SpO2 100 %.  PHYSICAL EXAMINATION:  GENERAL:  84 y.o.-year-old patient lying in the bed with no acute distress.  EYES: Pupils equal, round, reactive to light and accommodation. No scleral icterus. Extraocular muscles intact.  HEENT: Head atraumatic, normocephalic. Oropharynx and nasopharynx clear.  NECK:  Supple, no jugular venous distention. No thyroid enlargement, no tenderness.  LUNGS: Normal breath sounds bilaterally, no wheezing, rales,rhonchi or crepitation. No use of accessory muscles of respiration.  CARDIOVASCULAR: S1, S2 normal. No murmurs, rubs, or gallops.  ABDOMEN: Soft, nontender, nondistended. Bowel sounds present. No organomegaly or mass.  EXTREMITIES: No pedal edema, cyanosis, or clubbing.  NEUROLOGIC: Patient is encephalopathic PSYCHIATRIC: The patient is  disoriented  SKIN: No obvious rash, lesion, or ulcer.    LABORATORY PANEL:   CBC Recent Labs  Lab 05/22/17 2327  WBC 14.9*  HGB 13.2  HCT 42.0  PLT 254    ------------------------------------------------------------------------------------------------------------------  Chemistries  Recent Labs  Lab 05/22/17 2327  05/23/17 1356  NA 159*   < > 157*  K 4.2  --   --   CL 124*  --   --   CO2 25  --   --   GLUCOSE 112*  --   --   BUN 45*  --   --   CREATININE 1.78*  --   --   CALCIUM 10.8*  --   --   AST 18  --   --   ALT 12*  --   --   ALKPHOS 81  --   --   BILITOT 0.7  --   --    < > = values in this interval not displayed.   ------------------------------------------------------------------------------------------------------------------  Cardiac Enzymes Recent Labs  Lab 05/23/17 1223  TROPONINI 0.09*   ------------------------------------------------------------------------------------------------------------------  RADIOLOGY:  Dg Chest Portable 1 View  Result Date: 05/23/2017 CLINICAL DATA:  84 year old male with cough. EXAM: PORTABLE CHEST 1 VIEW COMPARISON:  None. FINDINGS: The lungs are clear. There is no pleural effusion or pneumothorax. Top-normal cardiac silhouette. The aorta is tortuous. Osteopenia. No acute osseous pathology. IMPRESSION: No active disease. Electronically Signed   By: Elgie Collard M.D.   On: 05/23/2017 00:36    EKG:   Orders placed or performed during the hospital encounter of 05/22/17  . EKG 12-Lead  . EKG 12-Lead  . ED EKG  . ED EKG    ASSESSMENT AND PLAN:    This is a 84 year old male admitted for hyponatremia.  1.  Acute encephalopathy secondary to dehydration/ Hypernatremia:  Patient is totally altered with encephalopathy  Hydrate with D5W.   Check serum sodium every 6 hours Sodium 159-157 TSH is normal  2.  Elevated troponin: Secondary to rigors or demand ischemia.  Non-trending cardiac biomarkers troponin 0 0.14-0.09    Differential diagnosis includes poor renal clearance.  Continue aspirin  3.  CKD: Stage III; avoid nephrotoxic agents.  4.  Dementia: Continue Zoloft  and patient is more awake and alert.   currently n.p.o.  5.  BPH: Continue finasteride and tamsulosin when  patient is more awake and alert  6.  DVT prophylaxis: Heparin  7.  GI prophylaxis: None      All the records are reviewed and case discussed with Care Management/Social Workerr. Management plans discussed with the patient's HCPOA  and they are in agreement.  CODE STATUS: fc   TOTAL TIME TAKING CARE OF THIS PATIENT: 35 minutes.   POSSIBLE D/C IN 2 DAYS, DEPENDING ON CLINICAL CONDITION.  Note: This dictation was prepared with Dragon dictation along with smaller phrase technology. Any transcriptional errors that result from this process are unintentional.   Ramonita LabAruna Joanell Cressler M.D on 05/23/2017 at 4:32 PM  Between 7am to 6pm - Pager - [phone] After 6pm go to [email or web address] - password [email or web address]  Fabio Neighborsagle [facility]  Office  [phone]  CC: Primary care physician; Patient, No Pcp Per

## 2017-05-24 ENCOUNTER — Inpatient Hospital Stay: Payer: Medicare Other

## 2017-05-24 DIAGNOSIS — R1310 Dysphagia, unspecified: Secondary | ICD-10-CM

## 2017-05-24 LAB — BASIC METABOLIC PANEL
ANION GAP: 9 (ref 5–15)
BUN: 33 mg/dL — ABNORMAL HIGH (ref 6–20)
CO2: 22 mmol/L (ref 22–32)
Calcium: 10.3 mg/dL (ref 8.9–10.3)
Chloride: 123 mmol/L — ABNORMAL HIGH (ref 101–111)
Creatinine, Ser: 1.17 mg/dL (ref 0.61–1.24)
GFR calc Af Amer: >60 mL/min (ref >60)
GFR, EST NON AFRICAN AMERICAN: 52 mL/min — AB (ref >60)
Glucose, Bld: 101 mg/dL — ABNORMAL HIGH (ref 65–99)
POTASSIUM: 5 mmol/L (ref 3.5–5.1)
SODIUM: 154 mmol/L — AB (ref 135–145)

## 2017-05-24 LAB — LIPID PANEL
CHOL/HDL RATIO: 2.7 ratio
CHOLESTEROL: 146 mg/dL (ref 0–200)
HDL: 54 mg/dL (ref >40)
LDL Cholesterol: 73 mg/dL (ref 0–99)
Triglycerides: 95 mg/dL (ref <150)
VLDL: 19 mg/dL (ref 0–40)

## 2017-05-24 LAB — GLUCOSE, CAPILLARY: Glucose-Capillary: 91 mg/dL (ref 65–99)

## 2017-05-24 LAB — SODIUM: Sodium: 130 mmol/L — ABNORMAL LOW (ref 135–145)

## 2017-05-24 LAB — TSH: TSH: 0.493 u[IU]/mL (ref 0.350–4.500)

## 2017-05-24 MED ORDER — DEXTROSE-NACL 5-0.45 % IV SOLN
INTRAVENOUS | Status: AC
  Administered 2017-05-24: 10:00:00 via INTRAVENOUS

## 2017-05-24 MED ORDER — CHLORHEXIDINE GLUCONATE 0.12 % MT SOLN
15.0000 mL | Freq: Two times a day (BID) | OROMUCOSAL | Status: DC
  Administered 2017-05-25 – 2017-05-26 (×3): 15 mL via OROMUCOSAL
  Filled 2017-05-24 (×3): qty 15

## 2017-05-24 MED ORDER — ORAL CARE MOUTH RINSE
15.0000 mL | Freq: Two times a day (BID) | OROMUCOSAL | Status: DC
  Administered 2017-05-25 – 2017-05-26 (×3): 15 mL via OROMUCOSAL

## 2017-05-24 NOTE — Plan of Care (Signed)
  Progressing Education: Knowledge of General Education information will improve 05/24/2017 1217 - Progressing by Burnett KanarisPerez, Adetokunbo Mccadden N, RN Health Behavior/Discharge Planning: Ability to manage health-related needs will improve 05/24/2017 1217 - Progressing by Burnett KanarisPerez, Jameson Tormey N, RN Clinical Measurements: Ability to maintain clinical measurements within normal limits will improve 05/24/2017 1217 - Progressing by Burnett KanarisPerez, Tina Gruner N, RN Will remain free from infection 05/24/2017 1217 - Progressing by Burnett KanarisPerez, Valoree Agent N, RN Diagnostic test results will improve 05/24/2017 1217 - Progressing by Burnett KanarisPerez, Terrah Decoster N, RN Respiratory complications will improve 05/24/2017 1217 - Progressing by Burnett KanarisPerez, Gladys Gutman N, RN Cardiovascular complication will be avoided 05/24/2017 1217 - Progressing by Burnett KanarisPerez, Rechel Delosreyes N, RN Activity: Risk for activity intolerance will decrease 05/24/2017 1217 - Progressing by Burnett KanarisPerez, Kamayah Pillay N, RN Nutrition: Adequate nutrition will be maintained 05/24/2017 1217 - Progressing by Burnett KanarisPerez, Shakaya Bhullar N, RN Coping: Level of anxiety will decrease 05/24/2017 1217 - Progressing by Burnett KanarisPerez, Lafreda Casebeer N, RN Elimination: Will not experience complications related to bowel motility 05/24/2017 1217 - Progressing by Burnett KanarisPerez, Luciel Brickman N, RN Will not experience complications related to urinary retention 05/24/2017 1217 - Progressing by Burnett KanarisPerez, Xayla Puzio N, RN Safety: Ability to remain free from injury will improve 05/24/2017 1217 - Progressing by Burnett KanarisPerez, Dyllan Kats N, RN Skin Integrity: Risk for impaired skin integrity will decrease 05/24/2017 1217 - Progressing by Burnett KanarisPerez, Marcas Bowsher N, RN

## 2017-05-24 NOTE — Progress Notes (Signed)
[facility] - [place] at [facility]   PATIENT NAME: Ronald James    MR#:  [id number]  DATE OF BIRTH:  02-04-1922  SUBJECTIVE:  CHIEF COMPLAINT:   Patient is altered, moans but not opening eyes.  Patient seems to be aspirating REVIEW OF SYSTEMS:  Review of system unobtainable as the patient is lethargic  DRUG ALLERGIES:   Allergies  Allergen Reactions  . Brimonidine Other (See Comments)    Reaction: unknown  . Latanoprost Other (See Comments)    Reaction: unknown  . Lisinopril Other (See Comments)    Reaction: unknown  . Prazosin Other (See Comments)    Reaction: unknown    VITALS:  Blood pressure 124/62, pulse 69, temperature (!) 97.5 F (36.4 C), temperature source Oral, resp. rate 16, weight 63.5 kg (140 lb), SpO2 100 %.  PHYSICAL EXAMINATION:  GENERAL:  84 y.o.-year-old patient lying in the bed with no acute distress.  EYES: Pupils equal, round, reactive to light and accommodation. No scleral icterus. Extraocular muscles intact.  HEENT: Head atraumatic, normocephalic. Oropharynx and nasopharynx clear.  NECK:  Supple, no jugular venous distention. No thyroid enlargement, no tenderness.  LUNGS: Normal breath sounds bilaterally, no wheezing, rales,rhonchi or crepitation. No use of accessory muscles of respiration.  CARDIOVASCULAR: S1, S2 normal. No murmurs, rubs, or gallops.  ABDOMEN: Soft, nontender, nondistended. Bowel sounds present. No organomegaly or mass.  EXTREMITIES: No pedal edema, cyanosis, or clubbing.  NEUROLOGIC: Patient is encephalopathic PSYCHIATRIC: The patient is  disoriented  SKIN: No obvious rash, lesion, or ulcer.    LABORATORY PANEL:   CBC Recent Labs  Lab 05/22/17 2327  WBC 14.9*  HGB 13.2  HCT 42.0  PLT 254   ------------------------------------------------------------------------------------------------------------------  Chemistries  Recent Labs  Lab 05/22/17 2327  05/24/17 0243 05/24/17 0818  NA 159*    < > 154* 130*  K 4.2  --  5.0  --   CL 124*  --  123*  --   CO2 25  --  22  --   GLUCOSE 112*  --  101*  --   BUN 45*  --  33*  --   CREATININE 1.78*  --  1.17  --   CALCIUM 10.8*  --  10.3  --   AST 18  --   --   --   ALT 12*  --   --   --   ALKPHOS 81  --   --   --   BILITOT 0.7  --   --   --    < > = values in this interval not displayed.   ------------------------------------------------------------------------------------------------------------------  Cardiac Enzymes Recent Labs  Lab 05/23/17 1824  TROPONINI 0.09*   ------------------------------------------------------------------------------------------------------------------  RADIOLOGY:  Dg Chest Port 1 View  Result Date: 05/24/2017 CLINICAL DATA:  Shortness of breath. EXAM: PORTABLE CHEST 1 VIEW COMPARISON:  05/22/2017 FINDINGS: The patient has developed minimal atelectasis at the left lung base. The lungs are otherwise clear. Heart size and vascularity are normal. No significant bone abnormality. IMPRESSION: New minimal atelectasis at the left lung base. Otherwise, essentially normal exam. Electronically Signed   By: Francene Boyers M.D.   On: 05/24/2017 12:21   Dg Chest Portable 1 View  Result Date: 05/23/2017 CLINICAL DATA:  84 year old male with cough. EXAM: PORTABLE CHEST 1 VIEW COMPARISON:  None. FINDINGS: The lungs are clear. There is no pleural effusion or pneumothorax. Top-normal cardiac silhouette. The aorta is tortuous. Osteopenia. No acute osseous pathology. IMPRESSION: No active disease.  Electronically Signed   By: Elgie CollardArash  Radparvar M.D.   On: 05/23/2017 00:36    EKG:   Orders placed or performed during the hospital encounter of 05/22/17  . EKG 12-Lead  . EKG 12-Lead  . ED EKG  . ED EKG    ASSESSMENT AND PLAN:    This is a 84 year old male admitted for hyponatremia.  1.  Acute encephalopathy secondary to dehydration/ Hypernatremia:  Patient is totally altered with encephalopathy  Hydrated with  D5W, change IV fluids to D5 half-normal saline  Check serum sodium levels Sodium 159-157-154-130 TSH is normal  2.  Elevated troponin: Secondary to rigors or demand ischemia.  Non-trending cardiac biomarkers troponin 0 0.14-0.09    Differential diagnosis includes poor renal clearance.  Continue aspirin  3.  CKD: Stage III; avoid nephrotoxic agents.  4.  Dementia: Continue Zoloft and patient is more awake and alert.    currently n.p.o. for possible aspiration  chest x-ray is negative except for atelectasis  5.  BPH: Continue finasteride and tamsulosin when  patient is more awake and alert  6.  DVT prophylaxis: Heparin  7.  GI prophylaxis: None  Failure to thrive with history of dementia Palliative care is consulted will have a family meeting with patient's healthcare power of attorney, son and wife tomorrow    All the records are reviewed and case discussed with Care Management/Social Workerr. Management plans discussed with the patient's HCPOA  and they are in agreement.  CODE STATUS: fc   TOTAL TIME TAKING CARE OF THIS PATIENT: 35 minutes.   POSSIBLE D/C IN 2 DAYS, DEPENDING ON CLINICAL CONDITION.  Note: This dictation was prepared with Dragon dictation along with smaller phrase technology. Any transcriptional errors that result from this process are unintentional.   Ramonita LabAruna Kinzleigh Kandler M.D on 05/24/2017 at 3:03 PM  Between 7am to 6pm - Pager - [phone] After 6pm go to [email or web address] - password [email or web address]  Fabio Neighborsagle [facility]  Office  [phone]  CC: Primary care physician; Patient, No Pcp Per

## 2017-05-24 NOTE — Evaluation (Signed)
Clinical/Bedside Swallow Evaluation Patient Details  Name: Ronald James MRN: [id number] Date of Birth: [birth year]  Today's Date: 05/24/2017 Time: SLP Start Time (ACUTE ONLY): 1115 SLP Stop Time (ACUTE ONLY): 1200 SLP Time Calculation (min) (ACUTE ONLY): 45 min  Past Medical History:  Past Medical History:  Diagnosis Date  . Anemia   . BPH (benign prostatic hyperplasia)   . Dementia   . Osteoarthritis   . PTSD (post-traumatic stress disorder)   . Vitamin D deficiency    Past Surgical History: History reviewed. No pertinent surgical history. HPI:   Pt is a 84 y.o. male  with past medical history of Dementia, CKD stage III, BPH, osteoarthritis, PTSD, Vitamin D deficiency admitted on 05/22/2017 with hypernatremia r/t poor intake and dehydration; malnutrition. Reduced Cognitive alertness, drowsy w/ congested cough currently.     Assessment / Plan / Recommendation Clinical Impression  Pt appeared to present w/ moderate-severe oropharyngeal phase dysphagia and is at high risk for aspiration and pulmonary decline secondary to negative sequelae of aspiration. Pt demonstrated reduced labial-lingual movements to accept and manage boluses orally; delayed A-P transfer noted. During oral motor movements, all motor movements appeared weak overall. During the pharyngeal phase of swallowing, no pharyngeal swallow appreciated w/ trials of ice chips. With the trial of Honey consistency liquid via TSP, pt exhibited similar oral phase deficits followed by pharyngeal phase deficits of suspected delayed pharyngeal swallow initiation, wet, gurgly swallowing and respirations following, multiple audible swallows in attempts to clear the pharynx-larynx of the bolus residue, and weak delayed coughing - pt required support and time and verbal cues to cough hard and expectorate phlegm and bolus residue post trial. Nothing further was given d/t the severity of the suspected swallowing deficits and aspiration risk. Recommend  NPO status; Palliative Care following for GOC; NSG and MD updated. Recommend frequent oral care while NPO. SLP Visit Diagnosis: Dysphagia, oropharyngeal phase (R13.12);Dysphagia, pharyngeal phase (R13.13)    Aspiration Risk  Severe aspiration risk;Risk for inadequate nutrition/hydration    Diet Recommendation  NPO w/ frequent oral care by NSG; aspiration precautions  Medication Administration: Via alternative means    Other  Recommendations Recommended Consults: (Palliative Care following; Dietician) Oral Care Recommendations: Oral care QID;Staff/trained caregiver to provide oral care Other Recommendations: (TBD)   Follow up Recommendations (TBD)      Frequency and Duration min 3x week  2 weeks       Prognosis Prognosis for Safe Diet Advancement: Guarded Barriers to Reach Goals: Cognitive deficits;Severity of deficits      Swallow Study   General Date of Onset: 05/22/17 Type of Study: Bedside Swallow Evaluation Previous Swallow Assessment: unknown Diet Prior to this Study: (unknown) Temperature Spikes Noted: (temp 97.5;  wbc 14.9) Respiratory Status: Nasal cannula(2 liters) History of Recent Intubation: No Behavior/Cognition: Cooperative;Pleasant mood;Distractible;Requires cueing(weak, drowsy/fatigued) Oral Cavity Assessment: Excessive secretions(phlegm orally; dried lips) Oral Care Completed by SLP: Yes Oral Cavity - Dentition: (some native dentition) Vision: (n/a) Self-Feeding Abilities: Total assist Patient Positioning: Upright in bed Baseline Vocal Quality: Low vocal intensity(weak) Volitional Cough: Cognitively unable to elicit Volitional Swallow: Unable to elicit    Oral/Motor/Sensory Function Overall Oral Motor/Sensory Function: Generalized oral weakness(moderate overall w/ labial and lingual movements) Facial Symmetry: Within Functional Limits   Ice Chips Ice chips: Impaired Presentation: Spoon(fed; 2 trials) Oral Phase Impairments: Reduced labial  seal;Reduced lingual movement/coordination;Poor awareness of bolus Oral Phase Functional Implications: Prolonged oral transit Pharyngeal Phase Impairments: (no pharyngeal swallow appreciated)   Thin Liquid Thin Liquid: Not tested  Nectar Thick Nectar Thick Liquid: Not tested   Honey Thick Honey Thick Liquid: Impaired Presentation: Spoon(fed; 1 trial) Oral Phase Impairments: Reduced labial seal;Reduced lingual movement/coordination;Poor awareness of bolus Oral Phase Functional Implications: Prolonged oral transit Pharyngeal Phase Impairments: Suspected delayed Swallow;Decreased hyoid-laryngeal movement;Multiple swallows;Wet Vocal Quality(gurgly quality; audible swallows) Other Comments: pt required support and time and verbal cues to cough hard and expectorate phlegm and bolus residue post trial   Puree Puree: Not tested   Solid   GO   Solid: Not tested          Ronald SomKatherine Watson, MS, CCC-SLP Ronald James 05/24/2017,5:54 PM

## 2017-05-24 NOTE — Plan of Care (Signed)
Oriented to self. Pt NPO. Baseline immobility. Low bed for safety. Not impulsive this shift. No s/sx of pain or discomfort. Na 154. Pt got combative when lab came to draw blood this am. Afterwards pt was fine.  Education: Knowledge of General Education information will improve 05/24/2017 [id number] - Not Met (add Reason) by Jeffie Pollock, RN   Health Behavior/Discharge Planning: Ability to manage health-related needs will improve 05/24/2017 0412 - Not Progressing by Jeffie Pollock, RN   Clinical Measurements: Ability to maintain clinical measurements within normal limits will improve 05/24/2017 0412 - Progressing by Jeffie Pollock, RN Will remain free from infection 05/24/2017 0412 - Progressing by Jeffie Pollock, RN Diagnostic test results will improve 05/24/2017 0412 - Progressing by Jeffie Pollock, RN Respiratory complications will improve 05/24/2017 0412 - Progressing by Jeffie Pollock, RN Cardiovascular complication will be avoided 05/24/2017 0412 - Progressing by Jeffie Pollock, RN   Activity: Risk for activity intolerance will decrease 05/24/2017 0412 - Not Met (add Reason) by Jeffie Pollock, RN   Nutrition: Adequate nutrition will be maintained 05/24/2017 0412 - Not Met (add Reason) by Jeffie Pollock, RN   Coping: Level of anxiety will decrease 05/24/2017 0412 - Not Progressing by Jeffie Pollock, RN   Elimination: Will not experience complications related to bowel motility 05/24/2017 0412 - Not Progressing by Jeffie Pollock, RN Will not experience complications related to urinary retention 05/24/2017 0412 - Progressing by Jeffie Pollock, RN   Safety: Ability to remain free from injury will improve 05/24/2017 0412 - Progressing by Jeffie Pollock, RN   Skin Integrity: Risk for impaired skin integrity will decrease 05/24/2017 0412 - Progressing by Jeffie Pollock, RN

## 2017-05-24 NOTE — Progress Notes (Addendum)
Daily Progress Note   Patient Name: Ronald James       Date: 05/24/2017 DOB: March 28, 1922  Age: 84 y.o. MRN#: [id number] Attending Physician: Ramonita Lab, MD Primary Care Physician: Patient, No Pcp Per Admit Date: 05/22/2017  Reason for Consultation/Follow-up: Establishing goals of care  Subjective: Mr. Lukes has more of a wet quality to his voice today. Appears to be struggling with oral secretions more but denies any distress, discomfort, or breathing difficulty. Of note he is confused and asking many questions about his surroundings.   Length of Stay: 1  Current Medications: Scheduled Meds:  . acidophilus  1 capsule Oral BID  . aspirin EC  81 mg Oral Daily  . cholecalciferol  1,000 Units Oral Daily  . docusate sodium  100 mg Oral BID  . dorzolamide-timolol  1 drop Right Eye BID  . finasteride  5 mg Oral Daily  . heparin  5,000 Units Subcutaneous Q8H  . hydrocerin  1 application Topical BID  . mouth rinse  15 mL Mouth Rinse BID  . Melatonin  5 mg Oral QHS  . polyvinyl alcohol  1 drop Both Eyes TID  . sertraline  50 mg Oral QHS  . tamsulosin  0.4 mg Oral QHS    Continuous Infusions: . dextrose 5 % and 0.45% NaCl 75 mL/hr at 05/24/17 0953    PRN Meds: acetaminophen **OR** acetaminophen, guaiFENesin, ipratropium-albuterol, ondansetron **OR** ondansetron (ZOFRAN) IV, senna-docusate, traMADol  Physical Exam         Constitutional:  Thin, frail, elderly  Cardiovascular: Normal rate and regular rhythm.  Pulmonary/Chest: Upper airway congestion. Wet vocal quality. No accessory muscle usage. No tachypnea. No respiratory distress.  Abdominal: Soft. Normal appearance.  Neurological: He is alert.  Oriented to self only Nursing note and vitals reviewed.   Vital Signs: BP 124/62  (BP Location: Right Arm)   Pulse 69   Temp (!) 97.5 F (36.4 C) (Oral)   Resp 16   Wt 63.5 kg (140 lb)   SpO2 100%  SpO2: SpO2: 100 % O2 Device: O2 Device: Nasal Cannula O2 Flow Rate: O2 Flow Rate (L/min): 2 L/min  Intake/output summary:   Intake/Output Summary (Last 24 hours) at 05/24/2017 1116 Last data filed at 05/24/2017 0314 Gross per 24 hour  Intake 2051.66 ml  Output -  Net 2051.66  ml   LBM: Last BM Date: 05/23/17 Baseline Weight: Weight: 52.2 kg (115 lb) Most recent weight: Weight: 63.5 kg (140 lb)       Palliative Assessment/Data:    Flowsheet Rows     Most Recent Value  Intake Tab  Referral Department  Hospitalist  Unit at Time of Referral  Med/Surg Unit  Palliative Care Primary Diagnosis  Sepsis/Infectious Disease  Date Notified  05/23/17  Palliative Care Type  New Palliative care  Reason for referral  Clarify Goals of Care  Date of Admission  05/22/17  Date first seen by Palliative Care  05/23/17  # of days Palliative referral response time  0 Day(s)  # of days IP prior to Palliative referral  1  Clinical Assessment  Psychosocial & Spiritual Assessment  Palliative Care Outcomes      Patient Active Problem List   Diagnosis Date Noted  . Hypernatremia 05/23/2017  . Protein-calorie malnutrition, severe 05/23/2017  . Dementia without behavioral disturbance   . Goals of care, counseling/discussion   . Palliative care encounter     Palliative Care Assessment & Plan   HPI: 84 y.o. male  with past medical history of dementia, CKD stage III, BPH, osteoarthritis, PTSD, Vitamin D deficiency admitted on 05/22/2017 with hypernatremia r/t poor intake and dehydration. Palliative care requested to assist with GOC conversations.    Assessment: I spoke further with Mr. Adela [facility]Floyd [facility] today. He asked about how Mr. Bing PlumeHaynes is doing and I expressed that I fear not well and that his swallowing is still extremely concerning. SLP reported giving him ~1 tsp honey thick  liquid and he had severe difficulty with even this small amount.   I also spoke with Mr. Bing PlumeHaynes son, Carmie KannerMack Jr., with permission from Mr. Adela LankFloyd. I explained further to Cherre HugerMack that his father is still very ill and explained dementia, aspiration pneumonia, and compensatory response of decreased intake with swallowing difficulty. Explained that this is not a condition that we can fix. I explained that we are providing him IVF to rehydrate him that I am scared that this is only a bandaid and that he will continue to decline. We agreed to meet tomorrow afternoon to discuss options and plans on where to go from here.   Will meet with Mr. Orvan JulyFloyd, HCPOA, and son, Carmie KannerMack Jr., tomorrow 05/25/17 2-2:30 pm. They both seem to understand the severity of Mr. Bing PlumeHaynes condition.   Recommendations/Plan:  Severe dysphagia: NPO status. SLP reports that no consistency would be comfortable for Mr. Bing PlumeHaynes in this condition. Consider robinul IV 0.2 mg every 4 hours as needed to control secretions if this becomes a discomfort for him. He is certainly not a candidate for artificial feeding with age, frailty, and diagnosis of dementia (not shown to improve quantity or quality of life).    Code Status:  Full code - to be discussed tomorrow with HCPOA and family  Prognosis:   Very poor with recent recurrent aspiration pneumonia and declining intake.   Discharge Planning:  To Be Determined  Care plan was discussed with Dr. Amado CoeGouru.   Thank you for allowing the Palliative Medicine Team to assist in the care of this patient.   Total Time 35 min Prolonged Time Billed  no       Greater than 50%  of this time was spent counseling and coordinating care related to the above assessment and plan.  Yong ChannelAlicia Gianny Killman, NP Palliative Medicine Team Pager # [phone] (M-F 8a-5p) Team Phone # [phone] (Nights/Weekends)

## 2017-05-25 LAB — BASIC METABOLIC PANEL
Anion gap: 2 — ABNORMAL LOW (ref 5–15)
BUN: 24 mg/dL — AB (ref 6–20)
CALCIUM: 9.8 mg/dL (ref 8.9–10.3)
CO2: 24 mmol/L (ref 22–32)
CREATININE: 1 mg/dL (ref 0.61–1.24)
Chloride: 118 mmol/L — ABNORMAL HIGH (ref 101–111)
GFR calc non Af Amer: >60 mL/min (ref >60)
Glucose, Bld: 78 mg/dL (ref 65–99)
Potassium: 3.8 mmol/L (ref 3.5–5.1)
SODIUM: 144 mmol/L (ref 135–145)

## 2017-05-25 LAB — GLUCOSE, CAPILLARY
Glucose-Capillary: 70 mg/dL (ref 65–99)
Glucose-Capillary: 146 mg/dL — ABNORMAL HIGH (ref 65–99)

## 2017-05-25 LAB — SODIUM: Sodium: 148 mmol/L — ABNORMAL HIGH (ref 135–145)

## 2017-05-25 MED ORDER — DEXTROSE 50 % IV SOLN
1.0000 | Freq: Once | INTRAVENOUS | Status: AC
  Administered 2017-05-25: 50 mL via INTRAVENOUS

## 2017-05-25 MED ORDER — DEXTROSE 50 % IV SOLN
INTRAVENOUS | Status: AC
  Filled 2017-05-25: qty 50

## 2017-05-25 MED ORDER — MORPHINE SULFATE (CONCENTRATE) 10 MG/0.5ML PO SOLN: 5.0000 mg | ORAL | Status: DC | PRN

## 2017-05-25 MED ORDER — POLYVINYL ALCOHOL 1.4 % OP SOLN
1.0000 [drp] | Freq: Four times a day (QID) | OPHTHALMIC | Status: DC | PRN
  Filled 2017-05-25: qty 15

## 2017-05-25 MED ORDER — HALOPERIDOL LACTATE 5 MG/ML IJ SOLN: 2.0000 mg | Freq: Four times a day (QID) | INTRAMUSCULAR | Status: DC | PRN

## 2017-05-25 MED ORDER — BIOTENE DRY MOUTH MT LIQD: 15.0000 mL | OROMUCOSAL | Status: DC | PRN

## 2017-05-25 MED ORDER — GLYCOPYRROLATE 0.2 MG/ML IJ SOLN
0.2000 mg | INTRAMUSCULAR | Status: DC
  Administered 2017-05-25 – 2017-05-26 (×4): 0.2 mg via INTRAVENOUS
  Filled 2017-05-25 (×8): qty 1

## 2017-05-25 NOTE — Clinical Social Work Note (Signed)
CSW received consult that patient needs hospice placement.  CSW contacted patient's son Ronald James [phone] who stated he would like [facility] if possible because patient is a retired Arts development officerMarine and patient's son lives in the [place] area.  Patient's son said that if [facility], he would like [facility] in [place] as a second choice, and if he can not get in there he is in agreement to having [facility] is a long term care resident and would like hospice there.  [facility] and [facility] worker is named Jettie PaganJaime Grant [phone] ext. [phone] and she works 8am-4pm M-F.  [facility] worker has already left for the day and to call back on Friday.  [facility] with [facility] on Friday.  CSW to continue to follow patient's progress throughout discharge planning.  Ervin KnackEric R. Hassan Rowannterhaus, MSW, Theresia MajorsLCSWA [phone]  05/25/2017 6:58 PM

## 2017-05-25 NOTE — Progress Notes (Signed)
Patient ID: Ronald James, male   DOB: 1922/03/20, 84 y.o.   MRN: [id number]  [employer] PROGRESS NOTE  Rito Lecomte HQI:[id number] DOB: 01-May-1922 DOA: 05/22/2017 PCP: Patient, No Pcp Per  HPI/Subjective: Patient gurgling on secretions.  States he feels okay.  Offers no complaints.  States his breathing is okay.  Objective: Vitals:   05/24/17 1751 05/25/17 1403  BP: 109/62 (!) 158/75  Pulse: 73 91  Resp:  20  Temp:  98.2 F (36.8 C)  SpO2: 100% 96%    Intake/Output Summary (Last 24 hours) at 05/25/2017 1551 Last data filed at 05/24/2017 1915 Gross per 24 hour  Intake 420 ml  Output -  Net 420 ml   Filed Weights   05/23/17 0411 05/24/17 0401 05/24/17 1751  Weight: 48.2 kg (106 lb 3.2 oz) 63.5 kg (140 lb) 62.6 kg (138 lb)    ROS: Review of Systems  Unable to perform ROS: Acuity of condition  Respiratory: Positive for cough. Negative for shortness of breath.   Cardiovascular: Negative for chest pain.  Gastrointestinal: Negative for abdominal pain.  Genitourinary: Negative for dysuria.   Exam: Physical Exam  HENT:  Nose: No mucosal edema.  Mouth/Throat: No oropharyngeal exudate or posterior oropharyngeal edema.  Eyes: Conjunctivae, EOM and lids are normal. Pupils are equal, round, and reactive to light.  Neck: No JVD present. Carotid bruit is not present. No edema present. No thyroid mass and no thyromegaly present.  Cardiovascular: S1 normal and S2 normal. Exam reveals no gallop.  No murmur heard. Pulses:      Dorsalis pedis pulses are 2+ on the right side, and 2+ on the left side.  Respiratory: No respiratory distress. He has decreased breath sounds in the right lower field and the left lower field. He has wheezes in the right lower field and the left lower field. He has no rhonchi. He has no rales.  Upper airway congestion  GI: Soft. Bowel sounds are normal. There is no tenderness.  Musculoskeletal:       Right ankle: He exhibits no swelling.       Left ankle: He  exhibits no swelling.  Lymphadenopathy:    He has no cervical adenopathy.  Neurological: He is alert.  Skin: Skin is warm. No rash noted. Nails show no clubbing.  Psychiatric: He has a normal mood and affect.      Data Reviewed: Basic Metabolic Panel: Recent Labs  Lab 05/22/17 2327  05/23/17 1958 05/24/17 0243 05/24/17 0818 05/25/17 0247 05/25/17 1439  NA 159*   < > 154* 154* 130* 144 148*  K 4.2  --   --  5.0  --  3.8  --   CL 124*  --   --  123*  --  118*  --   CO2 25  --   --  22  --  24  --   GLUCOSE 112*  --   --  101*  --  78  --   BUN 45*  --   --  33*  --  24*  --   CREATININE 1.78*  --   --  1.17  --  1.00  --   CALCIUM 10.8*  --   --  10.3  --  9.8  --    < > = values in this interval not displayed.   Liver Function Tests: Recent Labs  Lab 05/22/17 2327  AST 18  ALT 12*  ALKPHOS 81  BILITOT 0.7  PROT 7.8  ALBUMIN 3.5  CBC: Recent Labs  Lab 05/22/17 2327  WBC 14.9*  HGB 13.2  HCT 42.0  MCV 88.7  PLT 254   Cardiac Enzymes: Recent Labs  Lab 05/23/17 0013 05/23/17 0437 05/23/17 1223 05/23/17 1824  TROPONINI 0.11* 0.14* 0.09* 0.09*    CBG: Recent Labs  Lab 05/23/17 2143 05/24/17 2116 05/25/17 0531 05/25/17 0634  GLUCAP 114* 91 70 146*    Recent Results (from the past 240 hour(s))  Blood culture (routine x 2)     Status: None (Preliminary result)   Collection Time: 05/22/17 11:27 PM  Result Value Ref Range Status   Specimen Description BLOOD RIGHT ARM  Final   Special Requests   Final    BOTTLES DRAWN AEROBIC AND ANAEROBIC Blood Culture adequate volume   Culture   Final    NO GROWTH 2 DAYS Performed at [facility], [address], [place], [place] [address]    Report Status PENDING  Incomplete  Blood culture (routine x 2)     Status: None (Preliminary result)   Collection Time: 05/23/17 12:13 AM  Result Value Ref Range Status   Specimen Description BLOOD LT FOREARM  Final   Special Requests   Final    BOTTLES  DRAWN AEROBIC AND ANAEROBIC Blood Culture adequate volume   Culture   Final    NO GROWTH 2 DAYS Performed at [facility], [address] [place], [place] [address]    Report Status PENDING  Incomplete  MRSA PCR Screening     Status: None   Collection Time: 05/23/17  4:08 AM  Result Value Ref Range Status   MRSA by PCR NEGATIVE NEGATIVE Final    Comment:        The GeneXpert MRSA Assay (FDA approved for NASAL specimens only), is one component of a comprehensive MRSA colonization surveillance program. It is not intended to diagnose MRSA infection nor to guide or monitor treatment for MRSA infections. Performed at [facility], [address] [place], [place] [address]      Studies: Dg Chest [facility] 1 View  Result Date: 05/24/2017 CLINICAL DATA:  Shortness of breath. EXAM: PORTABLE CHEST 1 VIEW COMPARISON:  05/22/2017 FINDINGS: The patient has developed minimal atelectasis at the left lung base. The lungs are otherwise clear. Heart size and vascularity are normal. No significant bone abnormality. IMPRESSION: New minimal atelectasis at the left lung base. Otherwise, essentially normal exam. Electronically Signed   By: Francene Boyers M.D.   On: 05/24/2017 12:21    Scheduled Meds: . dextrose      . chlorhexidine  15 mL Mouth Rinse BID  . dorzolamide-timolol  1 drop Right Eye BID  . glycopyrrolate  0.2 mg Intravenous Q4H  . hydrocerin  1 application Topical BID  . mouth rinse  15 mL Mouth Rinse q12n4p   Continuous Infusions:  Assessment/Plan:  1. Acute encephalopathy, dehydration and hypernatremia.  Failure to thrive.  Patient has failed swallow evaluation.  Appreciate palliative care consultation.  Family and POA interested in hospice.  Care manager to look into the [place] [facility].  Comfort care measures. 2. Elevated troponin.  Demand ischemia 3. Acute kidney injury.  Creatinine improved to 1.0. 4. History of dementia 5. History of BPH  Code Status:      Code Status Orders  (From admission, onward)        Start     Ordered   05/25/17 1542  Do not attempt resuscitation (DNR)  Continuous    Question Answer Comment  In the event of cardiac or respiratory ARREST Do not call a "code blue"   In the event of cardiac or respiratory ARREST Do not perform Intubation, CPR, defibrillation or ACLS   In the event of cardiac or respiratory ARREST Use medication by any route, position, wound care, and other measures to relive pain and suffering. May use oxygen, suction and manual treatment of airway obstruction as needed for comfort.      05/25/17 1541    Code Status History    Date Active Date Inactive Code Status Order ID Comments User Context   05/25/2017 15:40 05/25/2017 15:41 DNR [id number]  Ulice BoldParker, Alicia C, NP Inpatient   05/23/2017 03:49 05/25/2017 15:40 Full Code [id number]  Arnaldo Nataliamond, Michael S, MD ED     Family Communication: as per palliative care Disposition Plan: comfort care, hospice home  Consultants:  Palliative care  Time spent: 28 minutes  Vetta Couzens [employer]  [employer]

## 2017-05-25 NOTE — Progress Notes (Signed)
Daily Progress Note   Patient Name: Verbon Giangregorio       Date: 05/25/2017 DOB: 03/03/1922  Age: 84 y.o. MRN#: [id number] Attending Physician: Loletha Grayer, MD Primary Care Physician: Patient, No Pcp Per Admit Date: 05/22/2017  Reason for Consultation/Follow-up: Establishing goals of care  Subjective: Mr. Bohlken is much unchanged from yesterday.   Length of Stay: 2  Current Medications: Scheduled Meds:  . dextrose      . acidophilus  1 capsule Oral BID  . aspirin EC  81 mg Oral Daily  . chlorhexidine  15 mL Mouth Rinse BID  . cholecalciferol  1,000 Units Oral Daily  . docusate sodium  100 mg Oral BID  . dorzolamide-timolol  1 drop Right Eye BID  . finasteride  5 mg Oral Daily  . heparin  5,000 Units Subcutaneous Q8H  . hydrocerin  1 application Topical BID  . mouth rinse  15 mL Mouth Rinse q12n4p  . Melatonin  5 mg Oral QHS  . polyvinyl alcohol  1 drop Both Eyes TID  . sertraline  50 mg Oral QHS  . tamsulosin  0.4 mg Oral QHS    Continuous Infusions:   PRN Meds: acetaminophen **OR** acetaminophen, guaiFENesin, ipratropium-albuterol, ondansetron **OR** ondansetron (ZOFRAN) IV, senna-docusate, traMADol  Physical Exam         Constitutional:  Thin, frail, elderly  Cardiovascular: Normal rate and regular rhythm.  Pulmonary/Chest: Upper airway congestion. Wet vocal quality. Rhonchi. No accessory muscle usage. No tachypnea. No respiratory distress.  Abdominal: Soft. Normal appearance.  Neurological: He is alert.  Oriented to self only Nursing note and vitals reviewed.   Vital Signs: BP (!) 158/75 (BP Location: Right Arm)   Pulse 91   Temp 98.2 F (36.8 C) (Oral)   Resp 20   Wt 62.6 kg (138 lb)   SpO2 96%  SpO2: SpO2: 96 % O2 Device: O2 Device: Not Delivered O2  Flow Rate: O2 Flow Rate (L/min): 2 L/min  Intake/output summary:   Intake/Output Summary (Last 24 hours) at 05/25/2017 1418 Last data filed at 05/24/2017 1915 Gross per 24 hour  Intake 420 ml  Output -  Net 420 ml   LBM: Last BM Date: 05/23/17 Baseline Weight: Weight: 52.2 kg (115 lb) Most recent weight: Weight: 62.6 kg (138 lb)  Palliative Assessment/Data: 20%   Flowsheet Rows     Most Recent Value  Intake Tab  Referral Department  Hospitalist  Unit at Time of Referral  Med/Surg Unit  Palliative Care Primary Diagnosis  Sepsis/Infectious Disease  Date Notified  05/23/17  Palliative Care Type  New Palliative care  Reason for referral  Clarify Goals of Care  Date of Admission  05/22/17  Date first seen by Palliative Care  05/23/17  # of days Palliative referral response time  0 Day(s)  # of days IP prior to Palliative referral  1  Clinical Assessment  Psychosocial & Spiritual Assessment  Palliative Care Outcomes      Patient Active Problem List   Diagnosis Date Noted  . Dysphagia   . Hypernatremia 05/23/2017  . Protein-calorie malnutrition, severe 05/23/2017  . Dementia without behavioral disturbance   . Goals of care, counseling/discussion   . Palliative care encounter     Palliative Care Assessment & Plan   HPI: 84 y.o. male  with past medical history of dementia, CKD stage III, BPH, osteoarthritis, PTSD, Vitamin D deficiency admitted on 05/22/2017 with hypernatremia r/t poor intake and dehydration. Palliative care requested to assist with [place] conversations.   Assessment: I met today with Mr. Pharoah Goggins Gasper Sells and son Kahne Helfand. We discussed Mr. Basnett declining status. He has only been at [facility] for ~10 weeks now. [name] explained how he has been able to tell decline in weakness and ambulation and cognition over the past year. I brought them up to date on the current problem with severe dysphagia and aspiration risks (even noted on small swab of water  during oral care) and lack of options at this stage of illness. Clearly explained that this is irreversible. We discussed feeding tubes and that with his frailty this would not be a good option and can lead to other complications such as continued aspiration and infection. I explained that the other path to take would be focus on keeping him comfortable and recognizing that we can manage symptoms but this will lead to end of life and soon. They both agree that comfort is the best path for Mr. Menter.   We further discussed where this care should take place and discussed hospice options. They express desire to pursue Hospice facility in [place]. Mr. Boateng served in [place], [place], and [place] Wars and served in the [employer] for 28 years. [place] is also closer to his HCPOA who has been able to visit with him here 2x/week but would like to be closer to visit more often. CSW consulted to assist.   We also discussed resuscitation and code status in response to comfort care. Franky Macho. says that he did discuss with his mother and they desire DNR. [name] agrees for DNR.   Recommendations/Plan:  Severe dysphagia: NPO status. SLP reports that no consistency would be comfortable for Mr. Mccombie in this condition. He is certainly not a candidate for artificial feeding with age, frailty, and diagnosis of dementia (not shown to improve quantity or quality of life).   Robinul 0.4 mg IV every 4 hours scheduled for secretion management.   Morphine solution 5 mg SL every 2 hours prn.   Code Status:  DNR  Prognosis:   Very poor with recent recurrent aspiration pneumonia and declining intake. Days to 1-2 weeks most likely.   Discharge Planning:  To Be Determined  Care plan was discussed with Dr. Leslye Peer.    Thank you for allowing  the Palliative Medicine Team to assist in the care of this patient.   Total Time 50 min Prolonged Time Billed  no       Greater than 50%  of this time was spent  counseling and coordinating care related to the above assessment and plan.  Vinie Sill, NP Palliative Medicine Team Pager # [phone] (M-F 8a-5p) Team Phone # [phone] (Nights/Weekends)

## 2017-05-25 NOTE — NC FL2 (Signed)
[place] MEDICAID FL2 LEVEL OF CARE SCREENING TOOL     IDENTIFICATION  Patient Name: Ronald James Birthdate: 27-Jul-1921 Sex: male Admission Date (Current Location): 05/22/2017  [place]   Facility and Address:  [facility], [address], [place], [place] [address]      Provider Number: [id number]  Attending Physician Name and Address:  Alford HighlandWieting, Richard, MD  Relative Name and Phone Number:  Carroll SageHaynes, Takari Son   [phone]     Current Level of Care: Hospital Recommended Level of Care: Skilled Nursing Facility Prior Approval Number:    Date Approved/Denied:   PASRR Number: [id number]  Discharge Plan: SNF    Current Diagnoses: Patient Active Problem List   Diagnosis Date Noted  . Dysphagia   . Hypernatremia 05/23/2017  . Protein-calorie malnutrition, severe 05/23/2017  . Dementia without behavioral disturbance   . Goals of care, counseling/discussion   . Palliative care encounter     Orientation RESPIRATION BLADDER Height & Weight     Self, Place  Normal Incontinent Weight: 138 lb (62.6 kg) Height:     BEHAVIORAL SYMPTOMS/MOOD NEUROLOGICAL BOWEL NUTRITION STATUS      Incontinent Diet  AMBULATORY STATUS COMMUNICATION OF NEEDS Skin   Total Care Verbally Normal                       Personal Care Assistance Level of Assistance  Total care, Bathing, Feeding, Dressing Bathing Assistance: Maximum assistance Feeding assistance: Maximum assistance Dressing Assistance: Maximum assistance Total Care Assistance: Maximum assistance   Functional Limitations Info  Sight, Hearing, Speech Sight Info: Adequate Hearing Info: Adequate Speech Info: Adequate    SPECIAL CARE FACTORS FREQUENCY                       Contractures Contractures Info: Not present    Additional Factors Info  Code Status, Allergies Code Status Info: DNR Allergies Info: BRIMONIDINE, LATANOPROST, LISINOPRIL, PRAZOSIN            Current Medications (05/25/2017):  This is the current hospital active medication list Current Facility-Administered Medications  Medication Dose Route Frequency Provider Last Rate Last Dose  . antiseptic oral rinse (BIOTENE) solution 15 mL  15 mL Topical PRN Ulice BoldParker, Alicia C, NP      . chlorhexidine (PERIDEX) 0.12 % solution 15 mL  15 mL Mouth Rinse BID Gouru, Aruna, MD   15 mL at 05/25/17 1126  . dorzolamide-timolol (COSOPT) 22.3-6.8 MG/ML ophthalmic solution 1 drop  1 drop Right Eye BID Arnaldo Nataliamond, Michael S, MD   1 drop at 05/25/17 1126  . glycopyrrolate (ROBINUL) injection 0.2 mg  0.2 mg Intravenous Q4H Yong ChannelParker, Alicia C, NP      . haloperidol lactate (HALDOL) injection 2 mg  2 mg Intravenous Q6H PRN Ulice BoldParker, Alicia C, NP      . hydrocerin (EUCERIN) cream 1 application  1 application Topical BID Arnaldo Nataliamond, Michael S, MD   1 application at 05/25/17 1126  . ipratropium-albuterol (DUONEB) 0.5-2.5 (3) MG/3ML nebulizer solution 3 mL  3 mL Nebulization Q6H PRN Arnaldo Nataliamond, Michael S, MD      . MEDLINE mouth rinse  15 mL Mouth Rinse q12n4p Gouru, Aruna, MD   15 mL at 05/25/17 1815  . morphine CONCENTRATE 10 MG/0.5ML oral solution 5 mg  5 mg Sublingual Q2H PRN Ulice BoldParker, Alicia C, NP      . ondansetron (ZOFRAN) injection 4 mg  4 mg Intravenous Q6H PRN Sheryle Hailiamond,  Kelton Pillar, MD      . polyvinyl alcohol (LIQUIFILM TEARS) 1.4 % ophthalmic solution 1 drop  1 drop Both Eyes QID PRN Ulice Bold, NP         Discharge Medications: Please see discharge summary for a list of discharge medications.  Relevant Imaging Results:  Relevant Lab Results:   Additional Information SSN [id number]  Darleene Cleaver, [place]

## 2017-05-25 NOTE — Plan of Care (Signed)
  Education: Knowledge of the prescribed therapeutic regimen will improve 05/25/2017 1609 - Not Progressing by Garwin Brothershomas, Shamia Uppal Lynn, RN  Pt and pt's family seen by Palliative Care this shift; pt made Comfort Care Pain Management: Satisfaction with pain management regimen will improve 05/25/2017 1609 - Not Progressing by Garwin Brothershomas, Zevin Nevares Lynn, RN

## 2017-05-25 NOTE — Clinical Social Work Note (Signed)
Clinical Social Work Assessment  Patient Details  Name: Ronald James MRN: [id number] Date of Birth: 1921/10/30  Date of referral:  05/25/17               Reason for consult:  Facility Placement                Permission sought to share information with:  Family Supports Permission granted to share information::  Yes, Verbal Permission Granted  Name::     Ronald MusterMack Waldroup, patient's son  Agency::  Hospice facility placement.  Relationship::     Contact Information:     Housing/Transportation Living arrangements for the past 2 months:  Skilled Nursing Facility Source of Information:  Adult Children Patient Interpreter Needed:  None Criminal Activity/Legal Involvement Pertinent to Current Situation/Hospitalization:  No - Comment as needed Significant Relationships:  Adult Children Lives with:  Facility Resident Do you feel safe going back to the place where you live?  Yes Need for family participation in patient care:  Yes (Comment)  Care giving concerns:  Patient's family would like patient to go to Hospice facility due to palliative's recommendation.   Social Worker assessment / plan:  Patient is a 84 year old male who is alert and oriented x2 and is a [facility].  Due to patient's dementia, assessment was completed by speaking with patient's son Ronald MusterMack Chicas, and reviewing patient's chart.  Patient's son states that they have been satisfied with the care at SNF, however he has seen a significant decrease in his cognitive abilities, and unable to eat much on his own.  Patient's son expressed that patient was in the [employer] for 28 years, and one of the first African [employer].  Patient's son is requesting patient to try to be admitted to the [facility] due to it being closer to where he lives, however patient's son would like [facility] as a second choice, and if he can't get there he is willing to have patient return [facility] and have Hospice follow there.  Patient's son was explained that sometimes it is hard to get in the [facility] and it would determine if there are any beds available and if they can accept him.  Patient's son is open to other locations if they are available.    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  Patient/Family's Understanding of and Emotional Response to Diagnosis, Current Treatment, and Prognosis:  Paitent's son is appreciative of how well patient is being taken care of at the hospital.  Emotional Assessment Appearance:  Appears stated age Attitude/Demeanor/Rapport:    Affect (typically observed):  Calm Orientation:  Oriented to Self Alcohol / Substance use:  Not Applicable Psych involvement (Current and /or in the community):  No (Comment)  Discharge Needs  Concerns to be addressed:  Care Coordination Readmission within the last 30 days:  No Current discharge risk:  None Barriers to Discharge:  Hospice Bed not available   Darleene Cleavernterhaus, Eldwin Volkov R, LCSWA 05/25/2017, 7:02 PM

## 2017-05-26 DIAGNOSIS — L8990 Pressure ulcer of unspecified site, unspecified stage: Secondary | ICD-10-CM

## 2017-05-26 MED ORDER — MORPHINE SULFATE (CONCENTRATE) 10 MG/0.5ML PO SOLN
5.0000 mg | ORAL | 0 refills | Status: AC | PRN
Start: 2017-05-26 — End: ?

## 2017-05-26 NOTE — Discharge Summary (Signed)
Ronald James at [place] NAME: Ronald James    MR#:  [id number]  DATE OF BIRTH:  [birth year]  DATE OF ADMISSION:  05/22/2017 ADMITTING PHYSICIAN: Harrie Foreman, MD  DATE OF DISCHARGE: 05/26/2017  PRIMARY CARE PHYSICIAN: Patient, No Pcp Per    ADMISSION DIAGNOSIS:  Hypernatremia [E87.0] Elevated troponin [R74.8]  DISCHARGE DIAGNOSIS:  Active Problems:   Hypernatremia   Protein-calorie malnutrition, severe   Dementia without behavioral disturbance   Goals of care, counseling/discussion   Palliative care encounter   Dysphagia   Pressure injury of skin   SECONDARY DIAGNOSIS:   Past Medical History:  Diagnosis Date  . Anemia   . BPH (benign prostatic hyperplasia)   . Dementia   . Osteoarthritis   . PTSD (post-traumatic stress disorder)   . Vitamin D deficiency     HOSPITAL COURSE:   1.  Acute encephalopathy, dehydration and hypernatremia.  Patient has failure to thrive.  Patient has failed a swallow evaluation.  Appreciate palliative care consultation.  They met with family and POA.  [facility] with comfort [facility] measures.  You can use diet as needed for pleasure but he will probably aspirate anything that he eats.  Would do pure with honey thickened liquids. 2.  Elevated troponin is demand ischemia 3.  Acute kidney injury this has improved to a creatinine of 1.0 with hydration.  This will likely worsen at this point.  Patient will unable to keep up with his nutritional needs 4.  History of dementia 5.  History of BPH 6.  Glaucoma can continue eyedrops  DISCHARGE CONDITIONS:   Guarded  CONSULTS OBTAINED:  Palliative care  DRUG ALLERGIES:   Allergies  Allergen Reactions  . Brimonidine Other (See Comments)    Reaction: unknown  . Latanoprost Other (See Comments)    Reaction: unknown  . Lisinopril Other (See Comments)    Reaction: unknown  . Prazosin Other (See Comments)    Reaction:  unknown    DISCHARGE MEDICATIONS:   Allergies as of 05/26/2017      Reactions   Brimonidine Other (See Comments)   Reaction: unknown   Latanoprost Other (See Comments)   Reaction: unknown   Lisinopril Other (See Comments)   Reaction: unknown   Prazosin Other (See Comments)   Reaction: unknown      Medication List    STOP taking these medications   acidophilus Caps capsule   aspirin EC 81 MG tablet   cholecalciferol 1000 units tablet Commonly known as:  VITAMIN D   finasteride 5 MG tablet Commonly known as:  PROSCAR   guaiFENesin 100 MG/5ML liquid Commonly known as:  ROBITUSSIN   Melatonin 3 MG Tabs   potassium chloride 10 MEQ CR capsule Commonly known as:  MICRO-K   senna-docusate 8.6-50 MG tablet Commonly known as:  Senokot-S   sertraline 50 MG tablet Commonly known as:  ZOLOFT   tamsulosin 0.4 MG Caps capsule Commonly known as:  FLOMAX   traMADol 50 MG tablet Commonly known as:  ULTRAM     TAKE these medications   acetaminophen 325 MG tablet Commonly known as:  TYLENOL Take 650 mg by mouth 3 (three) times daily.   dorzolamide-timolol 22.3-6.8 MG/ML ophthalmic solution Commonly known as:  COSOPT Place 1 drop into the right eye 2 (two) times daily.   hydrocerin Crea Apply 1 application topically 2 (two) times daily.   ipratropium-albuterol 0.5-2.5 (3) MG/3ML Soln Commonly known as:  DUONEB Take 3 mLs by nebulization every 6 (six) hours as needed (wheezing/ shortness of breath).   morphine CONCENTRATE 10 MG/0.5ML Soln concentrated solution Place 0.25 mLs (5 mg total) under the tongue every 2 (two) hours as needed for severe pain or shortness of breath.   REFRESH TEARS 0.5 % Soln Generic drug:  carboxymethylcellulose Place 1 drop into both eyes 3 (three) times daily.        DISCHARGE INSTRUCTIONS:   Follow-up with hospice home team in 1 day  If you experience worsening of your admission symptoms, develop shortness of breath, life  threatening emergency, suicidal or homicidal thoughts you must seek medical attention immediately by calling 911 or calling your MD immediately  if symptoms less severe.  You Must read complete instructions/literature along with all the possible adverse reactions/side effects for all the Medicines you take and that have been prescribed to you. Take any new Medicines after you have completely understood and accept all the possible adverse reactions/side effects.   Please note  You were cared for by a hospitalist during your hospital stay. If you have any questions about your discharge medications or the care you received while you were in the hospital after you are discharged, you can call the unit and asked to speak with the hospitalist on call if the hospitalist that took care of you is not available. Once you are discharged, your primary care physician will handle any further medical issues. Please note that NO REFILLS for any discharge medications will be authorized once you are discharged, as it is imperative that you return to your primary care physician (or establish a relationship with a primary care physician if you do not have one) for your aftercare needs so that they can reassess your need for medications and monitor your lab values.    Today   CHIEF COMPLAINT:   Chief Complaint  Patient presents with  . Abnormal Lab    HISTORY OF PRESENT ILLNESS:  Ronald James  is a 84 y.o. male sent in with abnormal labs   VITAL SIGNS:  Blood pressure 133/78, pulse (!) 103, temperature 97.7 F (36.5 C), temperature source Oral, resp. rate (!) 21, weight 62.6 kg (138 lb), SpO2 96 %.    PHYSICAL EXAMINATION:  GENERAL:  84 y.o.-year-old cachectic patient lying in the bed with no acute distress.  EYES: Pupils equal, round, reactive to light and accommodation. No scleral icterus. Extraocular muscles intact.  HEENT: Head atraumatic, normocephalic. Oropharynx and nasopharynx clear.  NECK:   Supple, no jugular venous distention. No thyroid enlargement, no tenderness.  LUNGS: Normal breath sounds bilaterally, no wheezing, rales,rhonchi or crepitation. No use of accessory muscles of respiration.  CARDIOVASCULAR: S1, S2 tachycardic. No murmurs, rubs, or gallops.  ABDOMEN: Soft, non-tender, non-distended. Bowel sounds present. No organomegaly or mass.  EXTREMITIES: No pedal edema, cyanosis, or clubbing.  NEUROLOGIC: Patient moves his arms on his own.  Gait not checked.  PSYCHIATRIC: The patient is alert and answers questions.  SKIN: No obvious rash, lesion, or ulcer.   DATA REVIEW:   CBC Recent Labs  Lab 05/22/17 2327  WBC 14.9*  HGB 13.2  HCT 42.0  PLT 254    Chemistries  Recent Labs  Lab 05/22/17 2327  05/25/17 0247 05/25/17 1439  NA 159*   < > 144 148*  K 4.2   < > 3.8  --   CL 124*   < > 118*  --   CO2 25   < > 24  --  GLUCOSE 112*   < > 78  --   BUN 45*   < > 24*  --   CREATININE 1.78*   < > 1.00  --   CALCIUM 10.8*   < > 9.8  --   AST 18  --   --   --   ALT 12*  --   --   --   ALKPHOS 81  --   --   --   BILITOT 0.7  --   --   --    < > = values in this interval not displayed.    Cardiac Enzymes Recent Labs  Lab 05/23/17 1824  TROPONINI 0.09*    Microbiology Results  Results for orders placed or performed during the hospital encounter of 05/22/17  Blood culture (routine x 2)     Status: None (Preliminary result)   Collection Time: 05/22/17 11:27 PM  Result Value Ref Range Status   Specimen Description BLOOD RIGHT ARM  Final   Special Requests   Final    BOTTLES DRAWN AEROBIC AND ANAEROBIC Blood Culture adequate volume   Culture   Final    NO GROWTH 3 DAYS Performed at [facility], [address] [place], [place] [address]    Report Status PENDING  Incomplete  Blood culture (routine x 2)     Status: None (Preliminary result)   Collection Time: 05/23/17 12:13 AM  Result Value Ref Range Status   Specimen Description BLOOD LT  FOREARM  Final   Special Requests   Final    BOTTLES DRAWN AEROBIC AND ANAEROBIC Blood Culture adequate volume   Culture   Final    NO GROWTH 3 DAYS Performed at [facility], [address], [place], [place] [address]    Report Status PENDING  Incomplete  MRSA PCR Screening     Status: None   Collection Time: 05/23/17  4:08 AM  Result Value Ref Range Status   MRSA by PCR NEGATIVE NEGATIVE Final    Comment:        The GeneXpert MRSA Assay (FDA approved for NASAL specimens only), is one component of a comprehensive MRSA colonization surveillance program. It is not intended to diagnose MRSA infection nor to guide or monitor treatment for MRSA infections. Performed at [facility], [address], [place], [place] [address]     RADIOLOGY:  Dg Chest Port 1 View  Result Date: 05/24/2017 CLINICAL DATA:  Shortness of breath. EXAM: PORTABLE CHEST 1 VIEW COMPARISON:  05/22/2017 FINDINGS: The patient has developed minimal atelectasis at the left lung base. The lungs are otherwise clear. Heart size and vascularity are normal. No significant bone abnormality. IMPRESSION: New minimal atelectasis at the left lung base. Otherwise, essentially normal exam. Electronically Signed   By: Lorriane Shire M.D.   On: 05/24/2017 12:21     Management plans discussed with the patient and he is agreement.  CODE STATUS:     Code Status Orders  (From admission, onward)        Start     Ordered   05/25/17 1542  Do not attempt resuscitation (DNR)  Continuous    Question Answer Comment  In the event of cardiac or respiratory ARREST Do not call a "code blue"   In the event of cardiac or respiratory ARREST Do not perform Intubation, CPR, defibrillation or ACLS   In the event of cardiac or respiratory ARREST Use medication by any route, position, wound care, and other measures to  relive pain and suffering. May use oxygen, suction and manual treatment of airway obstruction as needed  for comfort.      05/25/17 1541    Code Status History    Date Active Date Inactive Code Status Order ID Comments User Context   05/25/2017 15:40 05/25/2017 15:41 DNR [id number]  Pershing Proud, NP Inpatient   05/23/2017 03:49 05/25/2017 15:40 Full Code [id number]  Harrie Foreman, MD ED      TOTAL TIME TAKING CARE OF THIS PATIENT: 32 minutes.    Loletha Grayer M.D on 05/26/2017 at 11:35 AM  Between 7am to 6pm - Pager - [phone]  After 6pm go to [email or web address] - password [email or web address] [facility]  [employer] [facility]  [phone]  CC: Primary care physician; Patient, No Pcp Per

## 2017-05-26 NOTE — Progress Notes (Signed)
Pt complaining of SOB. He stated he would like to try some oxygen via nasal cannula for comfort. Pt placed on 2L O2 via nasal cannula.

## 2017-05-26 NOTE — Progress Notes (Addendum)
Consult: [facility] [place]  LCSW is following up on choice with [facility] on 05/25/17. [facility] [phone] ext. [phone],  LCSW was able to discuss and complete referral.  [facility] [facility] [place] sent referral to review.  [facility] [facility] [place] [facility] to [facility] or [facility] for hosp[facility].  Plan: Pending review of referral at this time. Once reviewed LCSW will update family.   Updated Plan 11:24 AM [facility]. [address] [place] [address] [place] [address] [place] [facility] He will go to the 1st floor: [facility] (community living center) Report:  [phone] ext  [phone] Patient can arrive at any time, sooner the better per hospice team. Hospice admissions worker: Marijean NiemannJaime is calling son Kathlene NovemberMike and Carmie KannerMack Jr, to update and complete paperwork. Accepting physician:  Clide DeutscherBrian Talbot  MD has been notified as well as palliative care NP regarding plan of care. LCSW will follow up with family in case there are additional questions or needs.DNR form needed as well as DC summary. Patient will transport by EMS and LCSW will arrange.  RN made aware as well.   No currently needs at this time. [facility].   Deretha EmoryHannah Bryanna Yim LCSW, MSW Clinical Social Work: Optician, dispensingystem Wide Float Coverage for :  [phone]

## 2017-05-26 NOTE — Progress Notes (Addendum)
Pt [facility] [place] today. Report has been called to receiving RN Darin Engels([place]) and packet has been put together by CSW. Per CSW, pt's HCPOA has been notified of [facility] by [facility]. [facility]. Pt to transfer via [facility].

## 2017-05-26 NOTE — Plan of Care (Signed)
  Progressing Coping: Level of anxiety will decrease 05/26/2017 0531 - Progressing by Peterson LombardKlenner, Naliya Gish C, RN Elimination: Will not experience complications related to bowel motility 05/26/2017 0531 - Progressing by Peterson LombardKlenner, Meagen Limones C, RN Will not experience complications related to urinary retention 05/26/2017 0531 - Progressing by Peterson LombardKlenner, Elford Evilsizer C, RN Safety: Ability to remain free from injury will improve 05/26/2017 0531 - Progressing by Peterson LombardKlenner, Tinea Nobile C, RN Education: Knowledge of the prescribed therapeutic regimen will improve 05/26/2017 0531 - Progressing by Peterson LombardKlenner, Tessla Spurling C, RN Pain Management: Satisfaction with pain management regimen will improve 05/26/2017 0531 - Progressing by Peterson LombardKlenner, Shalisha Clausing C, RN   Not Applicable Education: Knowledge of General Education information will improve 05/26/2017 0531 - Not Applicable by Peterson LombardKlenner, Juda Lajeunesse C, RN Health Behavior/Discharge Planning: Ability to manage health-related needs will improve 05/26/2017 0531 - Not Applicable by Peterson LombardKlenner, Ramsie Ostrander C, RN Clinical Measurements: Ability to maintain clinical measurements within normal limits will improve 05/26/2017 0531 - Not Applicable by Peterson LombardKlenner, Louetta Hollingshead C, RN Will remain free from infection 05/26/2017 0531 - Not Applicable by Peterson LombardKlenner, Kadian Barcellos C, RN Diagnostic test results will improve 05/26/2017 0531 - Not Applicable by Peterson LombardKlenner, Dacey Milberger C, RN Respiratory complications will improve 05/26/2017 0531 - Not Applicable by Peterson LombardKlenner, Meliya Mcconahy C, RN Cardiovascular complication will be avoided 05/26/2017 0531 - Not Applicable by Peterson LombardKlenner, Dulcy Sida C, RN Activity: Risk for activity intolerance will decrease 05/26/2017 0531 - Not Applicable by Peterson LombardKlenner, Chais Fehringer C, RN Nutrition: Adequate nutrition will be maintained 05/26/2017 0531 - Not Applicable by Peterson LombardKlenner, Sharian Delia C, RN

## 2017-05-28 LAB — CULTURE, BLOOD (ROUTINE X 2)
CULTURE: NO GROWTH
CULTURE: NO GROWTH
SPECIAL REQUESTS: ADEQUATE
Special Requests: ADEQUATE

## 2017-06-09 DEATH — deceased

## 2018-05-11 IMAGING — DX DG CHEST 1V PORT
1 series · 1 of 1 positions shown · non-contrast
Comparison: 05/22/2017

CLINICAL DATA: Shortness of breath.

EXAM:
PORTABLE CHEST 1 VIEW

[chest ap]
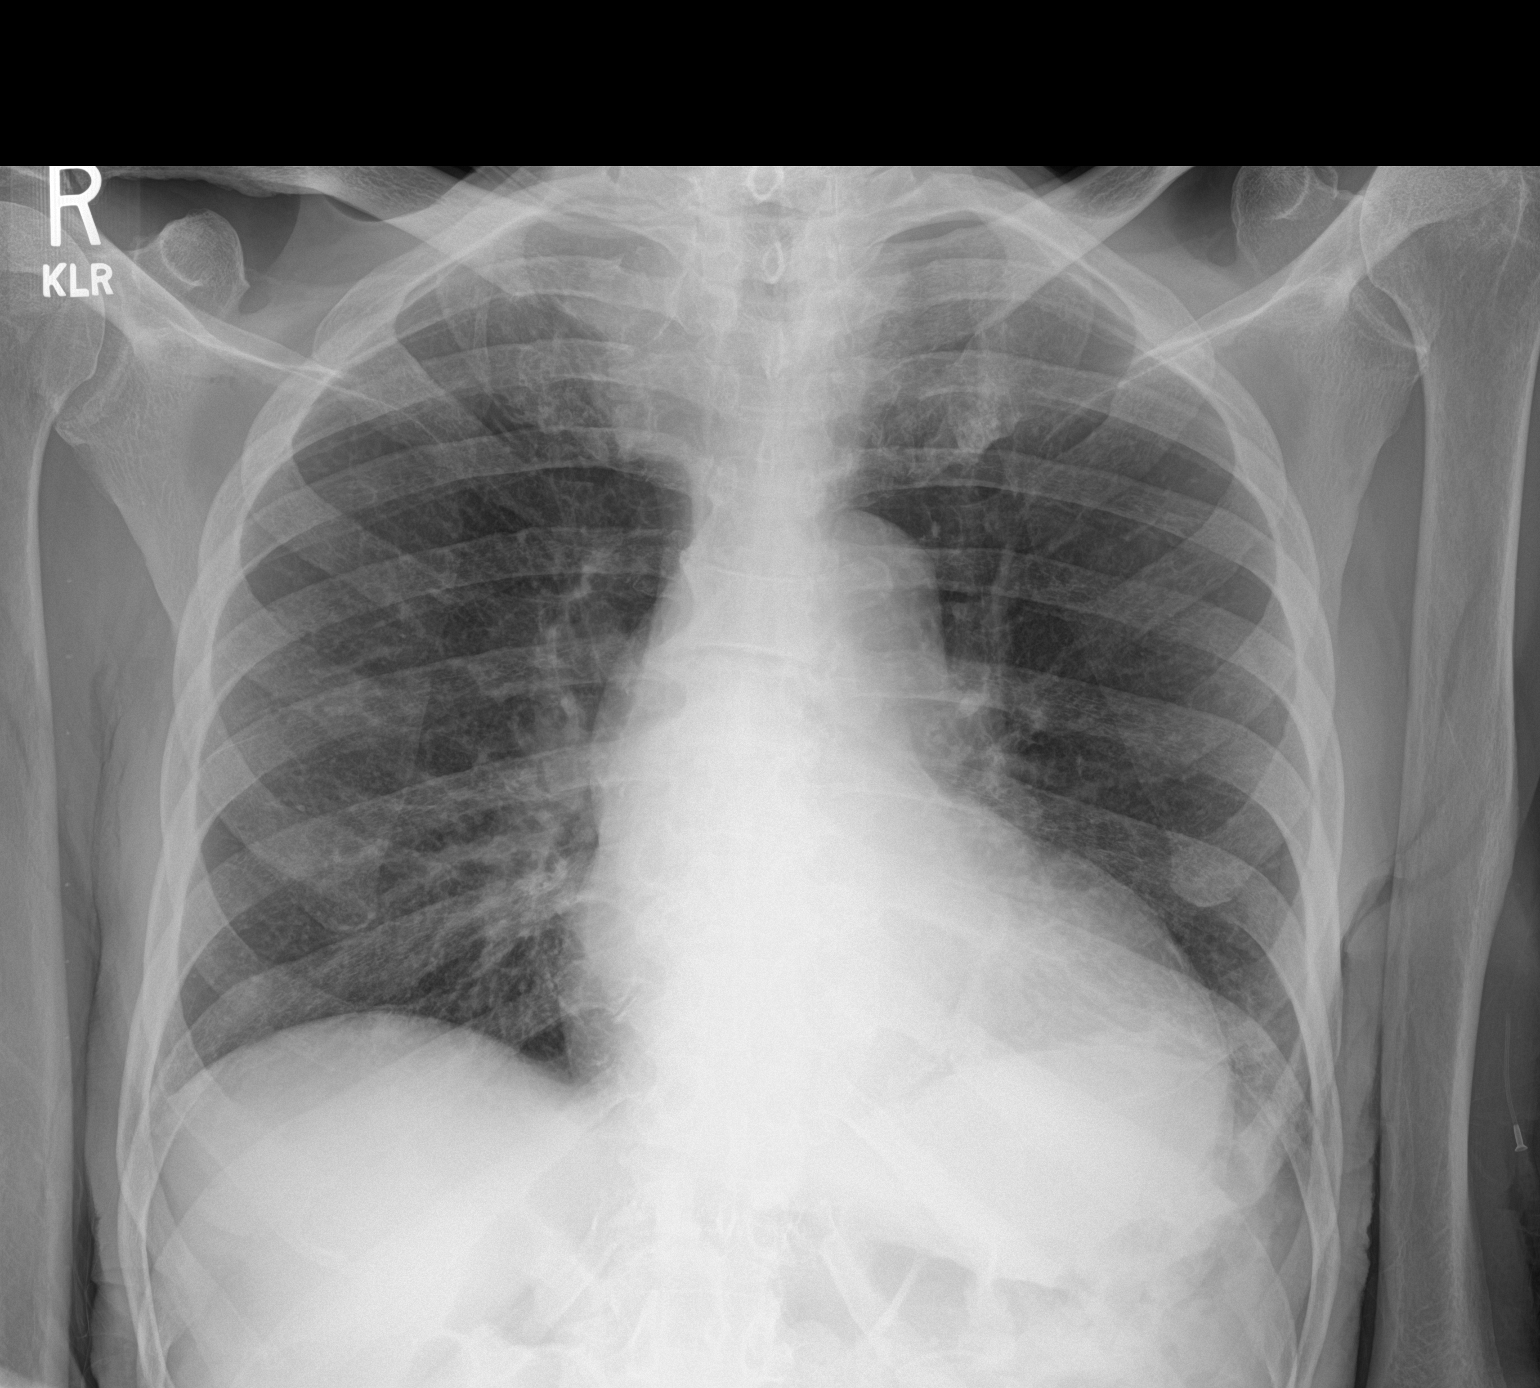

[1 of 1 positions shown; findings below may reference images not displayed]

FINDINGS: The patient has developed minimal atelectasis at the left lung base.
The lungs are otherwise clear. Heart size and vascularity are
normal. No significant bone abnormality.
IMPRESSION: New minimal atelectasis at the left lung base. Otherwise,
essentially normal exam.
